# Patient Record
Sex: Male | Born: 1985 | Race: White | Hispanic: No | Marital: Single | Smoking: Current some day smoker
Health system: Southern US, Community
[De-identification: ages and names within clinical notes are randomized; demographics above are authoritative.]

## PROBLEM LIST (undated history)

## (undated) DIAGNOSIS — Z720 Tobacco use: Secondary | ICD-10-CM

## (undated) DIAGNOSIS — F419 Anxiety disorder, unspecified: Secondary | ICD-10-CM

## (undated) DIAGNOSIS — J45909 Unspecified asthma, uncomplicated: Secondary | ICD-10-CM

---
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
## 2004-11-16 ENCOUNTER — Emergency Department (HOSPITAL_COMMUNITY): Admission: EM | Admit: 2004-11-16 | Discharge: 2004-11-16 | Payer: Self-pay | Admitting: Emergency Medicine

## 2005-12-22 ENCOUNTER — Emergency Department (HOSPITAL_COMMUNITY): Admission: EM | Admit: 2005-12-22 | Discharge: 2005-12-22 | Payer: Self-pay | Admitting: Emergency Medicine

## 2007-10-09 ENCOUNTER — Emergency Department (HOSPITAL_COMMUNITY): Admission: EM | Admit: 2007-10-09 | Discharge: 2007-10-09 | Payer: Self-pay | Admitting: Emergency Medicine

## 2010-02-14 ENCOUNTER — Emergency Department (HOSPITAL_BASED_OUTPATIENT_CLINIC_OR_DEPARTMENT_OTHER): Admission: EM | Admit: 2010-02-14 | Discharge: 2010-02-14 | Payer: Self-pay | Admitting: Emergency Medicine

## 2010-09-09 LAB — CBC
HCT: 40.8 % (ref 39.0–52.0)
Hemoglobin: 14.2 g/dL (ref 13.0–17.0)
MCH: 31 pg (ref 26.0–34.0)
MCHC: 34.8 g/dL (ref 30.0–36.0)

## 2010-09-09 LAB — DIFFERENTIAL
Basophils Relative: 3 % — ABNORMAL HIGH (ref 0–1)
Eosinophils Absolute: 0.1 10*3/uL (ref 0.0–0.7)
Monocytes Absolute: 0.5 10*3/uL (ref 0.1–1.0)
Monocytes Relative: 7 % (ref 3–12)

## 2010-09-09 LAB — BASIC METABOLIC PANEL
BUN: 11 mg/dL (ref 6–23)
CO2: 27 mEq/L (ref 19–32)
Calcium: 9.2 mg/dL (ref 8.4–10.5)
Glucose, Bld: 94 mg/dL (ref 70–99)
Sodium: 139 mEq/L (ref 135–145)

## 2010-09-26 ENCOUNTER — Emergency Department (HOSPITAL_COMMUNITY)
Admission: EM | Admit: 2010-09-26 | Discharge: 2010-09-27 | Disposition: A | Discharge status: Home / Self Care | Payer: BC Managed Care – PPO | Attending: Emergency Medicine | Admitting: Emergency Medicine

## 2010-09-26 DIAGNOSIS — L2989 Other pruritus: Secondary | ICD-10-CM | POA: Insufficient documentation

## 2010-09-26 DIAGNOSIS — F329 Major depressive disorder, single episode, unspecified: Secondary | ICD-10-CM | POA: Insufficient documentation

## 2010-09-26 DIAGNOSIS — R209 Unspecified disturbances of skin sensation: Secondary | ICD-10-CM | POA: Insufficient documentation

## 2010-09-26 DIAGNOSIS — F3289 Other specified depressive episodes: Secondary | ICD-10-CM | POA: Insufficient documentation

## 2010-09-26 DIAGNOSIS — F988 Other specified behavioral and emotional disorders with onset usually occurring in childhood and adolescence: Secondary | ICD-10-CM | POA: Insufficient documentation

## 2010-09-26 DIAGNOSIS — R42 Dizziness and giddiness: Secondary | ICD-10-CM | POA: Insufficient documentation

## 2010-09-26 DIAGNOSIS — H5710 Ocular pain, unspecified eye: Secondary | ICD-10-CM | POA: Insufficient documentation

## 2010-09-26 DIAGNOSIS — H11419 Vascular abnormalities of conjunctiva, unspecified eye: Secondary | ICD-10-CM | POA: Insufficient documentation

## 2010-09-26 DIAGNOSIS — L298 Other pruritus: Secondary | ICD-10-CM | POA: Insufficient documentation

## 2010-09-26 DIAGNOSIS — R259 Unspecified abnormal involuntary movements: Secondary | ICD-10-CM | POA: Insufficient documentation

## 2010-09-26 DIAGNOSIS — H5789 Other specified disorders of eye and adnexa: Secondary | ICD-10-CM | POA: Insufficient documentation

## 2010-09-26 DIAGNOSIS — R29810 Facial weakness: Secondary | ICD-10-CM | POA: Insufficient documentation

## 2010-09-26 DIAGNOSIS — Z79899 Other long term (current) drug therapy: Secondary | ICD-10-CM | POA: Insufficient documentation

## 2010-09-27 LAB — POCT I-STAT, CHEM 8
Calcium, Ion: 1.09 mmol/L — ABNORMAL LOW (ref 1.12–1.32)
Glucose, Bld: 106 mg/dL — ABNORMAL HIGH (ref 70–99)
HCT: 48 % (ref 39.0–52.0)
Hemoglobin: 16.3 g/dL (ref 13.0–17.0)
TCO2: 26 mmol/L (ref 0–100)

## 2015-12-16 ENCOUNTER — Emergency Department (HOSPITAL_COMMUNITY)
Admission: EM | Admit: 2015-12-16 | Discharge: 2015-12-16 | Disposition: A | Discharge status: Home / Self Care | Payer: BLUE CROSS/BLUE SHIELD | Attending: Emergency Medicine | Admitting: Emergency Medicine

## 2015-12-16 ENCOUNTER — Encounter (HOSPITAL_COMMUNITY): Payer: Self-pay

## 2015-12-16 ENCOUNTER — Other Ambulatory Visit: Payer: Self-pay

## 2015-12-16 DIAGNOSIS — F410 Panic disorder [episodic paroxysmal anxiety] without agoraphobia: Secondary | ICD-10-CM | POA: Diagnosis not present

## 2015-12-16 DIAGNOSIS — F17200 Nicotine dependence, unspecified, uncomplicated: Secondary | ICD-10-CM | POA: Diagnosis not present

## 2015-12-16 DIAGNOSIS — R0602 Shortness of breath: Secondary | ICD-10-CM | POA: Diagnosis present

## 2015-12-16 HISTORY — DX: Anxiety disorder, unspecified: F41.9

## 2015-12-16 LAB — CBC WITH DIFFERENTIAL/PLATELET
BASOS PCT: 0 %
Basophils Absolute: 0 10*3/uL (ref 0.0–0.1)
EOS ABS: 0.2 10*3/uL (ref 0.0–0.7)
Eosinophils Relative: 3 %
HEMATOCRIT: 39.9 % (ref 39.0–52.0)
Hemoglobin: 14.7 g/dL (ref 13.0–17.0)
LYMPHS ABS: 1.9 10*3/uL (ref 0.7–4.0)
Lymphocytes Relative: 37 %
MCH: 32 pg (ref 26.0–34.0)
MCHC: 36.8 g/dL — AB (ref 30.0–36.0)
MCV: 86.9 fL (ref 78.0–100.0)
MONO ABS: 0.4 10*3/uL (ref 0.1–1.0)
MONOS PCT: 8 %
NEUTROS ABS: 2.7 10*3/uL (ref 1.7–7.7)
Neutrophils Relative %: 52 %
Platelets: 175 10*3/uL (ref 150–400)
RBC: 4.59 MIL/uL (ref 4.22–5.81)
RDW: 11.9 % (ref 11.5–15.5)
WBC: 5.2 10*3/uL (ref 4.0–10.5)

## 2015-12-16 LAB — BASIC METABOLIC PANEL
ANION GAP: 9 (ref 5–15)
BUN: 13 mg/dL (ref 6–20)
CALCIUM: 8.8 mg/dL — AB (ref 8.9–10.3)
CO2: 22 mmol/L (ref 22–32)
CREATININE: 1.06 mg/dL (ref 0.61–1.24)
Chloride: 106 mmol/L (ref 101–111)
GFR calc non Af Amer: >60 mL/min (ref >60)
Glucose, Bld: 98 mg/dL (ref 65–99)
POTASSIUM: 3.1 mmol/L — AB (ref 3.5–5.1)
SODIUM: 137 mmol/L (ref 135–145)

## 2015-12-16 LAB — URINALYSIS, ROUTINE W REFLEX MICROSCOPIC
BILIRUBIN URINE: NEGATIVE
GLUCOSE, UA: NEGATIVE mg/dL
HGB URINE DIPSTICK: NEGATIVE
Ketones, ur: 15 mg/dL — AB
Leukocytes, UA: NEGATIVE
Nitrite: NEGATIVE
Protein, ur: NEGATIVE mg/dL
SPECIFIC GRAVITY, URINE: 1.024 (ref 1.005–1.030)
pH: 5.5 (ref 5.0–8.0)

## 2015-12-16 LAB — ETHANOL: Alcohol, Ethyl (B): <5 mg/dL (ref <5)

## 2015-12-16 LAB — D-DIMER, QUANTITATIVE (NOT AT ARMC)

## 2015-12-16 LAB — RAPID URINE DRUG SCREEN, HOSP PERFORMED
AMPHETAMINES: NOT DETECTED
BARBITURATES: NOT DETECTED
Benzodiazepines: NOT DETECTED
Cocaine: NOT DETECTED
OPIATES: NOT DETECTED
TETRAHYDROCANNABINOL: NOT DETECTED

## 2015-12-16 LAB — TROPONIN I

## 2015-12-16 NOTE — ED Notes (Signed)
Bed: WA03 Expected date:  Expected time:  Means of arrival:  Comments: EMS - 30 M, chest tightness/anxiety

## 2015-12-16 NOTE — ED Notes (Signed)
Pt cannot use restroom at this time, aware urine specimen is needed.  

## 2015-12-16 NOTE — Discharge Instructions (Signed)

## 2015-12-16 NOTE — ED Provider Notes (Signed)
CSN: [id number]     Arrival date & time 12/16/15  [id number] History   First MD Initiated Contact with Patient 12/16/15 0732     Chief Complaint  Patient presents with  . Shortness of Breath     (Consider location/radiation/quality/duration/timing/severity/associated sxs/prior Treatment) HPI   Randy Ortiz is a 30 y.o. male who presents for evaluation of chest pain, shortness of breath, shivering, weakness, near syncope. He was worried that he had a heart attack syndrome to the grocery store to get aspirin. E: EMS on the way, and they ultimately transferred him here. He took a clonazepam prior to leaving home, and now feels better. She is worried that he has had "a mini stroke". He denies prior similar problems, stress at work or home, or prior cardiac problems. He states that his doctor put him on when necessary clonazepam for treatment of "high blood pressure". He saw his doctor last week for a routine checkup. He works for a [employer]. There are no other known modifying factors.    Past Medical History  Diagnosis Date  . Anxiety    History reviewed. No pertinent past surgical history. History reviewed. No pertinent family history. Social History  Substance Use Topics  . Smoking status: Current Some Day Smoker  . Smokeless tobacco: None  . Alcohol Use: Yes     Comment: social    Review of Systems  All other systems reviewed and are negative.     Allergies  Review of patient's allergies indicates no known allergies.  Home Medications   Prior to Admission medications   Medication Sig Start Date End Date Taking? Authorizing Provider  albuterol (PROVENTIL HFA;VENTOLIN HFA) 108 (90 Base) MCG/ACT inhaler Inhale 1-2 puffs into the lungs every 6 (six) hours as needed for wheezing or shortness of breath.   Yes Historical Provider, MD  clonazePAM (KLONOPIN) 1 MG tablet Take 1 mg by mouth 2 (two) times daily as needed for anxiety.   Yes Historical Provider, MD  Multiple  Vitamin (MULTIVITAMIN WITH MINERALS) TABS tablet Take 1 tablet by mouth daily.   Yes Historical Provider, MD   BP 103/55 mmHg  Pulse 55  Temp(Src) 98.3 F (36.8 C) (Oral)  Resp 16  SpO2 98% Physical Exam  Constitutional: He is oriented to person, place, and time. He appears well-developed and well-nourished. He appears distressed (He is uncomfortable).  HENT:  Head: Normocephalic and atraumatic.  Right Ear: External ear normal.  Left Ear: External ear normal.  Eyes: Conjunctivae and EOM are normal. Pupils are equal, round, and reactive to light.  Neck: Normal range of motion and phonation normal. Neck supple.  Cardiovascular: Normal rate, regular rhythm and normal heart sounds.   Pulmonary/Chest: Effort normal and breath sounds normal. He exhibits no bony tenderness.  Abdominal: Soft. There is no tenderness.  Musculoskeletal: Normal range of motion.  Neurological: He is alert and oriented to person, place, and time. No cranial nerve deficit or sensory deficit. He exhibits normal muscle tone. Coordination normal.  Skin: Skin is warm, dry and intact.  Psychiatric: His behavior is normal. Judgment and thought content normal.  Depressed facies. Moderately anxious.  Nursing note and vitals reviewed.   ED Course  Procedures (including critical care time)  Medications - No data to display  Patient Vitals for the past 24 hrs:  BP Temp Temp src Pulse Resp SpO2  12/16/15 1030 103/55 mmHg - - (!) 55 16 98 %  12/16/15 1000 110/67 mmHg - - 67 17 97 %  12/16/15 0955 110/68 mmHg - - 82 25 100 %  12/16/15 0930 110/68 mmHg - - (!) 59 18 98 %  12/16/15 0900 122/81 mmHg - - 80 21 100 %  12/16/15 0830 127/85 mmHg - - 65 17 99 %  12/16/15 0800 128/86 mmHg - - 74 14 100 %  12/16/15 0733 122/91 mmHg 98.3 F (36.8 C) Oral 71 11 100 %    At discharge Reevaluation with update and discussion. After initial assessment and treatment, an updated evaluation reveals he remains comfortable. Findings  discussed with patient and all questions answered. Petina Muraski L     Labs Review Labs Reviewed  BASIC METABOLIC PANEL - Abnormal; Notable for the following:    Potassium 3.1 (*)    Calcium 8.8 (*)    All other components within normal limits  CBC WITH DIFFERENTIAL/PLATELET - Abnormal; Notable for the following:    MCHC 36.8 (*)    All other components within normal limits  URINALYSIS, ROUTINE W REFLEX MICROSCOPIC (NOT AT [facility]) - Abnormal; Notable for the following:    APPearance CLOUDY (*)    Ketones, ur 15 (*)    All other components within normal limits  ETHANOL  URINE RAPID DRUG SCREEN, HOSP PERFORMED  TROPONIN I  D-DIMER, QUANTITATIVE (NOT AT [facility])    Imaging Review No results found. I have personally reviewed and evaluated these images and lab results as part of my medical decision-making.   EKG Interpretation   Date/Time:  Thursday December 16 2015 07:32:59 EDT Ventricular Rate:  73 PR Interval:    QRS Duration: 91 QT Interval:  381 QTC Calculation: 420 R Axis:   75 Text Interpretation:  Sinus rhythm Since last tracing rate slower  Confirmed by Marquelle Musgrave  MD, Cyris Maalouf ([id number]) on 12/16/2015 7:39:52 AM      MDM   Final diagnoses:  Panic attack    Evaluation was consistent with panic attack. No evidence for acute cardiopulmonary compromise. Doubt serous bacterial infection or metabolic instability.   Nursing Notes Reviewed/ Care Coordinated Applicable Imaging Reviewed Interpretation of Laboratory Data incorporated into ED treatment  The patient appears reasonably screened and/or stabilized for discharge and I doubt any other medical condition or other [facility] requiring further screening, evaluation, or treatment in the ED at this time prior to discharge.  Plan: Home Medications- usual medications; Home Treatments- rest; return here if the recommended treatment, does not improve the symptoms; Recommended follow up- PCP prn     Mancel BaleElliott Blair Mesina, MD 12/16/15 [phone]

## 2015-12-16 NOTE — ED Notes (Addendum)
Per EMS, pt from [facility].  Pt woke up short of breath. Drove to [facility] to get an aspirin.  Called 911 on the way to store.  Pt states he started shivering.  Felt weak "like he was going to pass out". Pt took his clonazepam that he has for hx of anxiety.  One occurrence of same in past.  BP elevated on arrival by EMS at 160/100 and decreased in route.  Pt calmer on arrival.  Vitals:  118/82, hr 82, 97% ra, resp 18

## 2015-12-16 NOTE — ED Notes (Signed)
PT DISCHARGED. INSTRUCTIONS GIVEN. AAOX4. PT IN NO APPARENT DISTRESS OR PAIN. THE OPPORTUNITY TO ASK QUESTIONS WAS PROVIDED. 

## 2016-10-12 ENCOUNTER — Observation Stay (HOSPITAL_COMMUNITY): Payer: BLUE CROSS/BLUE SHIELD

## 2016-10-12 ENCOUNTER — Observation Stay (HOSPITAL_COMMUNITY)
Admission: EM | Admit: 2016-10-12 | Discharge: 2016-10-12 | Disposition: A | Discharge status: Home / Self Care | Payer: BLUE CROSS/BLUE SHIELD | Attending: Family Medicine | Admitting: Family Medicine

## 2016-10-12 ENCOUNTER — Emergency Department (HOSPITAL_COMMUNITY): Payer: BLUE CROSS/BLUE SHIELD

## 2016-10-12 ENCOUNTER — Encounter (HOSPITAL_COMMUNITY): Payer: Self-pay

## 2016-10-12 DIAGNOSIS — F17200 Nicotine dependence, unspecified, uncomplicated: Secondary | ICD-10-CM | POA: Diagnosis not present

## 2016-10-12 DIAGNOSIS — R200 Anesthesia of skin: Secondary | ICD-10-CM | POA: Diagnosis not present

## 2016-10-12 DIAGNOSIS — F1510 Other stimulant abuse, uncomplicated: Secondary | ICD-10-CM | POA: Diagnosis not present

## 2016-10-12 DIAGNOSIS — Z720 Tobacco use: Secondary | ICD-10-CM

## 2016-10-12 DIAGNOSIS — R202 Paresthesia of skin: Secondary | ICD-10-CM

## 2016-10-12 DIAGNOSIS — R531 Weakness: Secondary | ICD-10-CM | POA: Insufficient documentation

## 2016-10-12 DIAGNOSIS — E876 Hypokalemia: Secondary | ICD-10-CM | POA: Diagnosis present

## 2016-10-12 DIAGNOSIS — Z79899 Other long term (current) drug therapy: Secondary | ICD-10-CM | POA: Insufficient documentation

## 2016-10-12 DIAGNOSIS — G9340 Encephalopathy, unspecified: Secondary | ICD-10-CM | POA: Diagnosis present

## 2016-10-12 DIAGNOSIS — R29898 Other symptoms and signs involving the musculoskeletal system: Secondary | ICD-10-CM | POA: Diagnosis not present

## 2016-10-12 DIAGNOSIS — F419 Anxiety disorder, unspecified: Secondary | ICD-10-CM

## 2016-10-12 HISTORY — DX: Tobacco use: Z72.0

## 2016-10-12 LAB — CBC
HEMATOCRIT: 45.4 % (ref 39.0–52.0)
HEMOGLOBIN: 16.8 g/dL (ref 13.0–17.0)
MCH: 32.2 pg (ref 26.0–34.0)
MCHC: 37 g/dL — AB (ref 30.0–36.0)
MCV: 87 fL (ref 78.0–100.0)
Platelets: 186 10*3/uL (ref 150–400)
RBC: 5.22 MIL/uL (ref 4.22–5.81)
RDW: 12 % (ref 11.5–15.5)
WBC: 9.4 10*3/uL (ref 4.0–10.5)

## 2016-10-12 LAB — RAPID URINE DRUG SCREEN, HOSP PERFORMED
Amphetamines: POSITIVE — AB
BARBITURATES: NOT DETECTED
Benzodiazepines: NOT DETECTED
COCAINE: NOT DETECTED
Opiates: NOT DETECTED
Tetrahydrocannabinol: NOT DETECTED

## 2016-10-12 LAB — COMPREHENSIVE METABOLIC PANEL
ALK PHOS: 64 U/L (ref 38–126)
ALT: 17 U/L (ref 17–63)
AST: 29 U/L (ref 15–41)
Albumin: 5 g/dL (ref 3.5–5.0)
Anion gap: 10 (ref 5–15)
BILIRUBIN TOTAL: 1.4 mg/dL — AB (ref 0.3–1.2)
BUN: 12 mg/dL (ref 6–20)
CALCIUM: 9.9 mg/dL (ref 8.9–10.3)
CO2: 28 mmol/L (ref 22–32)
CREATININE: 1 mg/dL (ref 0.61–1.24)
Chloride: 99 mmol/L — ABNORMAL LOW (ref 101–111)
GFR calc Af Amer: >60 mL/min (ref >60)
Glucose, Bld: 106 mg/dL — ABNORMAL HIGH (ref 65–99)
POTASSIUM: 3.3 mmol/L — AB (ref 3.5–5.1)
Sodium: 137 mmol/L (ref 135–145)
TOTAL PROTEIN: 8 g/dL (ref 6.5–8.1)

## 2016-10-12 LAB — DIFFERENTIAL
BASOS ABS: 0 10*3/uL (ref 0.0–0.1)
Basophils Relative: 0 %
EOS ABS: 0 10*3/uL (ref 0.0–0.7)
Eosinophils Relative: 0 %
LYMPHS ABS: 0.8 10*3/uL (ref 0.7–4.0)
Lymphocytes Relative: 9 %
MONOS PCT: 7 %
Monocytes Absolute: 0.7 10*3/uL (ref 0.1–1.0)
Neutro Abs: 7.9 10*3/uL — ABNORMAL HIGH (ref 1.7–7.7)
Neutrophils Relative %: 84 %

## 2016-10-12 LAB — ACETAMINOPHEN LEVEL

## 2016-10-12 LAB — PROTIME-INR
INR: 1
Prothrombin Time: 13.2 seconds (ref 11.4–15.2)

## 2016-10-12 LAB — URINALYSIS, ROUTINE W REFLEX MICROSCOPIC
Bilirubin Urine: NEGATIVE
Glucose, UA: NEGATIVE mg/dL
Hgb urine dipstick: NEGATIVE
KETONES UR: NEGATIVE mg/dL
LEUKOCYTES UA: NEGATIVE
NITRITE: NEGATIVE
PROTEIN: NEGATIVE mg/dL
Specific Gravity, Urine: 1.001 — ABNORMAL LOW (ref 1.005–1.030)
pH: 7 (ref 5.0–8.0)

## 2016-10-12 LAB — HIV ANTIBODY (ROUTINE TESTING W REFLEX): HIV Screen 4th Generation wRfx: NONREACTIVE

## 2016-10-12 LAB — APTT: aPTT: 25 seconds (ref 24–36)

## 2016-10-12 LAB — I-STAT TROPONIN, ED: TROPONIN I, POC: 0 ng/mL (ref 0.00–0.08)

## 2016-10-12 LAB — SALICYLATE LEVEL

## 2016-10-12 LAB — ETHANOL: Alcohol, Ethyl (B): <5 mg/dL (ref <5)

## 2016-10-12 MED ORDER — GADOBENATE DIMEGLUMINE 529 MG/ML IV SOLN
20.0000 mL | Freq: Once | INTRAVENOUS | Status: AC | PRN
  Administered 2016-10-12: 16 mL via INTRAVENOUS

## 2016-10-12 MED ORDER — STROKE: EARLY STAGES OF RECOVERY BOOK
Freq: Once | Status: DC
  Filled 2016-10-12: qty 1

## 2016-10-12 MED ORDER — SENNOSIDES-DOCUSATE SODIUM 8.6-50 MG PO TABS: 1.0000 | ORAL_TABLET | Freq: Every evening | ORAL | Status: DC | PRN

## 2016-10-12 MED ORDER — LABETALOL HCL 5 MG/ML IV SOLN: 5.0000 mg | Freq: Once | INTRAVENOUS | Status: DC

## 2016-10-12 MED ORDER — NICOTINE 21 MG/24HR TD PT24
21.0000 mg | MEDICATED_PATCH | Freq: Every day | TRANSDERMAL | Status: DC
  Administered 2016-10-12: 21 mg via TRANSDERMAL
  Filled 2016-10-12: qty 1

## 2016-10-12 MED ORDER — ZOLPIDEM TARTRATE 5 MG PO TABS: 5.0000 mg | ORAL_TABLET | Freq: Every evening | ORAL | Status: DC | PRN

## 2016-10-12 MED ORDER — ACETAMINOPHEN 325 MG PO TABS: 650.0000 mg | ORAL_TABLET | ORAL | Status: DC | PRN

## 2016-10-12 MED ORDER — ACETAMINOPHEN 650 MG RE SUPP: 650.0000 mg | RECTAL | Status: DC | PRN

## 2016-10-12 MED ORDER — ASPIRIN 300 MG RE SUPP: 300.0000 mg | Freq: Every day | RECTAL | Status: DC

## 2016-10-12 MED ORDER — ENOXAPARIN SODIUM 40 MG/0.4ML ~~LOC~~ SOLN
40.0000 mg | SUBCUTANEOUS | Status: DC
  Administered 2016-10-12: 40 mg via SUBCUTANEOUS
  Filled 2016-10-12: qty 0.4

## 2016-10-12 MED ORDER — ACETAMINOPHEN 160 MG/5ML PO SOLN: 650.0000 mg | ORAL | Status: DC | PRN

## 2016-10-12 MED ORDER — SODIUM CHLORIDE 0.9 % IV SOLN
INTRAVENOUS | Status: DC
  Administered 2016-10-12: 09:00:00 via INTRAVENOUS

## 2016-10-12 MED ORDER — ALBUTEROL SULFATE (2.5 MG/3ML) 0.083% IN NEBU: 3.0000 mL | INHALATION_SOLUTION | Freq: Four times a day (QID) | RESPIRATORY_TRACT | Status: DC | PRN

## 2016-10-12 MED ORDER — POTASSIUM CHLORIDE 20 MEQ/15ML (10%) PO SOLN: 20.0000 meq | Freq: Once | ORAL | Status: DC

## 2016-10-12 MED ORDER — CLONAZEPAM 0.5 MG PO TABS: 0.5000 mg | ORAL_TABLET | Freq: Two times a day (BID) | ORAL | Status: DC | PRN

## 2016-10-12 MED ORDER — ASPIRIN 325 MG PO TABS: 325.0000 mg | ORAL_TABLET | Freq: Every day | ORAL | Status: DC

## 2016-10-12 NOTE — Progress Notes (Signed)
OT Cancellation Note  Patient Details Name: Randy Ortiz MRN: [id number] DOB: 24-Aug-1985   Cancelled Treatment:    Reason Eval/Treat Not Completed: Other (comment). Noted pt is on bedrest and MRI is ordered. Will check back.  Randy Ortiz 10/12/2016, 7:39 AM  Randy Ortiz, OTR/L [phone] 10/12/2016

## 2016-10-12 NOTE — ED Triage Notes (Signed)
States feels like the right side of his mind is going to make him pass out no deficits noted moves all extremities clear speech noted.

## 2016-10-12 NOTE — ED Provider Notes (Signed)
[facility]-EMERGENCY DEPT Provider Note   CSN: [id number] Arrival date & time: 10/12/16  0126  By signing my name below, I, Elder Negus, attest that this documentation has been prepared under the direction and in the presence of Devoria Albe, MD. Electronically Signed: Elder Negus, Scribe. 10/12/16. 2:18 AM.  Time seen 02:01 AM   History   Chief Complaint Chief Complaint  Patient presents with  . Near Syncope    HPI Randy Ortiz is a 31 y.o. male with history of anxiety on Adderall and Clonazepam who presents to the ED for evaluation of altered mental status. This patient states that he was at work 30 minutes PTA  when he developed weakness in the R arm, decreased sensation in the R face, and burning sensations across the entire body. He is intermittently stating that "he cannot think" and "he loses his train of thought". No headache. His friend drove him to this facility. At interview, he denies any vision changes, leg symptoms. He has never experienced these complaints before. No family history of stroke. The patient does state that he "took 1 or 2 extra Adderall" 8 hours ago. He has been prescribed this medication for 6 months; however he has been taking more than his typical dose in the last 2 days. He states "I'm addicted to it and I like the feeling of it". States he started using it recreationally when he was in school and he asked his doctors to putting on it at that time. He states 6 months ago he asked his primary care doctor to putting back on it because he was going to college. Unclear why exactly the patient is on Adderall. Denies any misdosing of his Clonazepam. States he takes it for panic contacts and only takes about 10 a month. Denies any other drug use. He denies any suicidal ideation. Patient answers questions oddly. When asked to describe the numbness or weakness in his extremities he starts talking about his thinking. We discussed that his arm and leg do not think he  needed to be more clear about what was going on with his arm and leg. He states his gait was normal. There is no family history of strokes.  The history is provided by the patient. No language interpreter was used.   [facility], Zoe Lan NP   Past Medical History:  Diagnosis Date  . Anxiety     Patient Active Problem List   Diagnosis Date Noted  . Anxiety     History reviewed. No pertinent surgical history.     Home Medications    Prior to Admission medications   Medication Sig Start Date End Date Taking? Authorizing Provider  albuterol (PROVENTIL HFA;VENTOLIN HFA) 108 (90 Base) MCG/ACT inhaler Inhale 1-2 puffs into the lungs every 6 (six) hours as needed for wheezing or shortness of breath.   Yes Historical Provider, MD  amphetamine-dextroamphetamine (ADDERALL) 20 MG tablet Take 20 mg by mouth daily. 10/06/16 10/06/17 Yes Historical Provider, MD  clonazePAM (KLONOPIN) 1 MG tablet Take 1 mg by mouth 2 (two) times daily as needed for anxiety.   Yes Historical Provider, MD    Family History History reviewed. No pertinent family history.  Social History Social History  Substance Use Topics  . Smoking status: Current Some Day Smoker  . Smokeless tobacco: Never Used  . Alcohol use Yes     Comment: social  works at [employer], states he was moved to a different area which is less stressful.  Allergies   Patient has no known allergies.   Review of Systems Review of Systems  Eyes: Negative for visual disturbance.  Neurological: Positive for weakness and numbness. Negative for speech difficulty and headaches.  Psychiatric/Behavioral:       Substance abuse  All other systems reviewed and are negative.    Physical Exam Updated Vital Signs BP (!) 154/101 (BP Location: Left Arm)   Pulse (!) 106   Temp 98 F (36.7 C) (Oral)   Resp 18   SpO2 98%   Vital signs normal except for hypertension and tachycardia   Physical Exam  Constitutional: He is  oriented to person, place, and time. He appears well-developed and well-nourished.  Non-toxic appearance. He does not appear ill. No distress.  HENT:  Head: Normocephalic and atraumatic.  Right Ear: External ear normal.  Left Ear: External ear normal.  Nose: Nose normal. No mucosal edema or rhinorrhea.  Mouth/Throat: Oropharynx is clear and moist and mucous membranes are normal. No dental abscesses or uvula swelling.  Eyes: Conjunctivae and EOM are normal. Pupils are equal, round, and reactive to light.  Pupils are dilated but reactive bilaterally.   Neck: Normal range of motion and full passive range of motion without pain. Neck supple.  Cardiovascular: Normal rate, regular rhythm and normal heart sounds.  Exam reveals no gallop and no friction rub.   No murmur heard. Pulmonary/Chest: Effort normal and breath sounds normal. No respiratory distress. He has no wheezes. He has no rhonchi. He has no rales. He exhibits no tenderness and no crepitus.  Abdominal: Soft. Normal appearance and bowel sounds are normal. He exhibits no distension. There is no tenderness. There is no rebound and no guarding.  Musculoskeletal: Normal range of motion. He exhibits no edema or tenderness.  Moves all extremities well.   Neurological: He is alert and oriented to person, place, and time. He has normal strength. No cranial nerve deficit.  Grip strength is equal. No motor weaknesses. No decreased sensation bilaterally. FTN coordinated.   Skin: Skin is warm, dry and intact. No rash noted. No erythema. No pallor.  Psychiatric: He has a normal mood and affect. His speech is normal and behavior is normal. His mood appears not anxious.  Nursing note and vitals reviewed.    ED Treatments / Results  Labs (all labs ordered are listed, but only abnormal results are displayed) Results for orders placed or performed during the hospital encounter of 10/12/16  Ethanol  Result Value Ref Range   Alcohol, Ethyl (B) <5 <5  mg/dL  Protime-INR  Result Value Ref Range   Prothrombin Time 13.2 11.4 - 15.2 seconds   INR 1.00   APTT  Result Value Ref Range   aPTT 25 24 - 36 seconds  CBC  Result Value Ref Range   WBC 9.4 4.0 - 10.5 K/uL   RBC 5.22 4.22 - 5.81 MIL/uL   Hemoglobin 16.8 13.0 - 17.0 g/dL   HCT 16.1 09.6 - 04.5 %   MCV 87.0 78.0 - 100.0 fL   MCH 32.2 26.0 - 34.0 pg   MCHC 37.0 (H) 30.0 - 36.0 g/dL   RDW 40.9 81.1 - 91.4 %   Platelets 186 150 - 400 K/uL  Differential  Result Value Ref Range   Neutrophils Relative % 84 %   Neutro Abs 7.9 (H) 1.7 - 7.7 K/uL   Lymphocytes Relative 9 %   Lymphs Abs 0.8 0.7 - 4.0 K/uL   Monocytes Relative 7 %   Monocytes  Absolute 0.7 0.1 - 1.0 K/uL   Eosinophils Relative 0 %   Eosinophils Absolute 0.0 0.0 - 0.7 K/uL   Basophils Relative 0 %   Basophils Absolute 0.0 0.0 - 0.1 K/uL  Comprehensive metabolic panel  Result Value Ref Range   Sodium 137 135 - 145 mmol/L   Potassium 3.3 (L) 3.5 - 5.1 mmol/L   Chloride 99 (L) 101 - 111 mmol/L   CO2 28 22 - 32 mmol/L   Glucose, Bld 106 (H) 65 - 99 mg/dL   BUN 12 6 - 20 mg/dL   Creatinine, Ser 1.61 0.61 - 1.24 mg/dL   Calcium 9.9 8.9 - 09.6 mg/dL   Total Protein 8.0 6.5 - 8.1 g/dL   Albumin 5.0 3.5 - 5.0 g/dL   AST 29 15 - 41 U/L   ALT 17 17 - 63 U/L   Alkaline Phosphatase 64 38 - 126 U/L   Total Bilirubin 1.4 (H) 0.3 - 1.2 mg/dL   GFR calc non Af Amer >60 >60 mL/min   GFR calc Af Amer >60 >60 mL/min   Anion gap 10 5 - 15  Urine rapid drug screen (hosp performed)not at [facility]  Result Value Ref Range   Opiates NONE DETECTED NONE DETECTED   Cocaine NONE DETECTED NONE DETECTED   Benzodiazepines NONE DETECTED NONE DETECTED   Amphetamines POSITIVE (A) NONE DETECTED   Tetrahydrocannabinol NONE DETECTED NONE DETECTED   Barbiturates NONE DETECTED NONE DETECTED  Urinalysis, Routine w reflex microscopic  Result Value Ref Range   Color, Urine COLORLESS (A) YELLOW   APPearance CLEAR CLEAR   Specific Gravity, Urine  1.001 (L) 1.005 - 1.030   pH 7.0 5.0 - 8.0   Glucose, UA NEGATIVE NEGATIVE mg/dL   Hgb urine dipstick NEGATIVE NEGATIVE   Bilirubin Urine NEGATIVE NEGATIVE   Ketones, ur NEGATIVE NEGATIVE mg/dL   Protein, ur NEGATIVE NEGATIVE mg/dL   Nitrite NEGATIVE NEGATIVE   Leukocytes, UA NEGATIVE NEGATIVE  I-stat troponin, ED (not at [facility])  Result Value Ref Range   Troponin i, poc 0.00 0.00 - 0.08 ng/mL   Comment 3           Laboratory interpretation all normal except for + UDS    EKG  EKG Interpretation  Date/Time:  Thursday October 12 2016 01:35:47 EDT Ventricular Rate:  113 PR Interval:    QRS Duration: 79 QT Interval:  351 QTC Calculation: 482 R Axis:   71 Text Interpretation:  Sinus tachycardia Consider right atrial enlargement Borderline repolarization abnormality Borderline prolonged QT interval Since last tracing rate faster Confirmed by Effie Shy  MD, ELLIOTT [phone]) on 10/12/2016 1:41:23 AM       Radiology Ct Head Code Stroke W/o Cm  Result Date: 10/12/2016 CLINICAL DATA:  Code stroke. Initial evaluation for right-sided facial numbness. EXAM: CT HEAD WITHOUT CONTRAST TECHNIQUE: Contiguous axial images were obtained from the base of the skull through the vertex without intravenous contrast. COMPARISON:  Prior CT from 10/09/2007. FINDINGS: Brain: Stable cerebral volume. Focal hyperdensity adjacent to the right lateral ventricle is stable, likely a small calcification. No acute intracranial hemorrhage. No evidence for acute large vessel territory infarct. No mass lesion, midline shift or mass effect. No hydrocephalus. No extra-axial fluid collection. Vascular: No hyperdense vessel. Skull: Scalp soft tissues within normal limits.  Calvarium intact. Sinuses/Orbits: Globes and oval soft tissues within normal limits. Paranasal sinuses and mastoids are clear. Other: None. ASPECTS [facility] Stroke Program Early CT Score) - Ganglionic level infarction (caudate, lentiform nuclei, internal  capsule, insula, M1-M3 cortex): 7 - Supraganglionic infarction (M4-M6 cortex): 3 Total score (0-10 with 10 being normal): 10 IMPRESSION: 1. No acute intracranial infarct or other process identified. 2. ASPECTS is 10 3. Subcentimeter calcification adjacent to the frontal horn of the right lateral ventricle, similar from 2009. Critical Value/emergent results were called by telephone at the time of interpretation on 10/12/2016 at 3:34 am to Dr. Devoria Albe , who verbally acknowledged these results. Electronically Signed   By: Rise Mu M.D.   On: 10/12/2016 03:39    Procedures Procedures (including critical care time)  Medications Ordered in ED Medications - No data to display   Initial Impression / Assessment and Plan / ED Course  I have reviewed the triage vital signs and the nursing notes.  Pertinent labs & imaging results that were available during my care of the patient were reviewed by me and considered in my medical decision making (see chart for details).    COORDINATION OF CARE: 2:17 AM Discussed obtaining a head CT and labwork. The patient verbalized agreement with this plan.   NIH score 0  02:35 AM pt discussed with Dr Roseanne Reno, Neurology, Code Stroke cancelled, recommends admission to hospitalist service here b/o adderal ingestion. States not a tPA candidate.  03:33 AM Radiology called the head CT results, normal  I was going to treat patient's tachycardia and hypertension with labetalol however his blood pressure improved without treatment. He had a residual borderline tachycardia around 105.  Patient was rechecked at 5 AM. We discussed his test results and the recommendation by the neurologist. He is agreeable to be admitted.  5:08 AM Dr Clyde Lundborg, hospitalist will admit  Final Clinical Impressions(s) / ED Diagnoses   Final diagnoses:  Anxiety  Numbness and tingling of right face  Numbness and tingling of right upper extremity  Adderall use disorder, mild, abuse     Plan admission  Devoria Albe, MD, FACEP    I personally performed the services described in this documentation, which was scribed in my presence. The recorded information has been reviewed and considered.  Devoria Albe, MD, Concha Pyo, MD 10/12/16 [phone]

## 2016-10-12 NOTE — Discharge Summary (Signed)
Physician Discharge Summary  Randy Ortiz ZOX:[id number] DOB: 1985/10/17 DOA: 10/12/2016  PCP: No PCP Per Patient  Admit date: 10/12/2016 Discharge date: 10/12/2016  Admitted From: Home Disposition: Home   Recommendations for Outpatient Follow-up:  1. Follow up with PCP as previously scheduled next week 2. Continue counseling regarding tobacco use, history of alcohol abuse (EtOH negative here) and adderall misuse (took "3 or 4" pills to stay up gaming; may be worsening anxiety).   Brief/Interim Summary: Randy Ortiz is a 31 y.o. male with a history of anxiety, alcohol abuse, and tobacco use who presented to the ED due to confusion.   While doing light work at his job on 3rd shift he noted an episode he finds difficult to characterize, described as whole body burning, dizziness, right "mind" numbness and "blacking in and out" without falling or weakness. This gradually resolved spontaneously after a few hours, but he was scared for stroke so he left work for the ED. On arrival the patient was oriented. HR 106bpm, BP 154/101, afebrile, no respiratory distress and labs were unremarkable, CT head was negative. UDS positive for amphetamines. He reported taking adderall  as needed for ADHD symptoms related to school but took "3 or 4" pills the previous evening to stay up gaming.   He was placed on telemetry for observation. Subsequent MRI was completely negative. The case was discussed with neurology who recommended no further work up. Adderall may have exacerbated underlying anxiety, triggering a panic attack, and the intentional overdose is concerning for addictive behavior. Symptoms may have been exacerbated by adderall-related sleep deprivation as well. His symptoms have remained resolved, with negative neurological exam, so it is advised that he hold adderall pending follow up with PCP. He is discharged in stable condition.  Discharge Diagnoses:  Principal Problem:   Acute  encephalopathy Active Problems:   Anxiety   Hypokalemia   Right facial numbness   Right arm weakness   Tobacco abuse   Adderall use disorder, mild, abuse  Intentional adderall overdose and acute encephalopathy: Neurologic work up negative as below. Symptoms resolved with IVF's and observation.  - Follow up with PCP, hold adderall for now  Anxiety: Chronic, stable.  - Ok to continue klonopin  Hypokalemia: K= 3.3 on admission. This was repleted and may be rechecked at follow up.   Tobacco abuse: - Brief cessation counseling provided. Per Care Everywhere records, he has endorsed alcohol overuse on weekends as well. He was counseled to limit alcohol consumption to < 2 drinks per day.  - Nicotine patch  Discharge Instructions Discharge Instructions    Discharge instructions    Complete by:  As directed    You were admitted with altered mental status. A stroke work up was completed and negative. It is suspected that the symptoms were related to adderall over use and this should be discontinued until you follow up with your primary provider. If your symptoms return, seek medical attention.     Allergies as of 10/12/2016   No Known Allergies     Medication List    STOP taking these medications   amphetamine-dextroamphetamine 20 MG tablet Commonly known as:  ADDERALL     TAKE these medications   albuterol 108 (90 Base) MCG/ACT inhaler Commonly known as:  PROVENTIL HFA;VENTOLIN HFA Inhale 1-2 puffs into the lungs every 6 (six) hours as needed for wheezing or shortness of breath.   clonazePAM 1 MG tablet Commonly known as:  KLONOPIN Take 1 mg by mouth 2 (two)  times daily as needed for anxiety.      Follow-up Information    Iona Hansen, NP Follow up.   Specialty:  Nurse Practitioner Contact information: [address] city [place] [address] [phone]          No Known Allergies  Consultations:  Neurology, Dr. Roseanne Reno and Dr. Roxy Manns by  phone.  Procedures/Studies: Mr Laqueta Jean OZ Contrast  Result Date: 10/12/2016 CLINICAL DATA:  Acute encephalopathy.  Adderall overdose last night. EXAM: MRI HEAD WITHOUT AND WITH CONTRAST TECHNIQUE: Multiplanar, multiecho pulse sequences of the brain and surrounding structures were obtained without and with intravenous contrast. CONTRAST:  16mL MULTIHANCE GADOBENATE DIMEGLUMINE 529 MG/ML IV SOLN COMPARISON:  Head CT from earlier today FINDINGS: Brain: No acute or remote infarction, hemorrhage, hydrocephalus, extra-axial collection or mass lesion. Right frontal white matter calcification by CT, stable since at least 2009, is difficult to visualize on this exam. No abnormal enhancement in this region. Minor FLAIR hyperintensity around the lateral ventricles, non acute. Normal brain volume. Vascular: Normal flow voids Skull and upper cervical spine: Negative Sinuses/Orbits: Dysconjugate gaze, nonspecific. IMPRESSION: Negative brain MRI.  No explanation for symptoms. Electronically Signed   By: Marnee Spring M.D.   On: 10/12/2016 08:50   Ct Head Code Stroke W/o Cm  Result Date: 10/12/2016 CLINICAL DATA:  Code stroke. Initial evaluation for right-sided facial numbness. EXAM: CT HEAD WITHOUT CONTRAST TECHNIQUE: Contiguous axial images were obtained from the base of the skull through the vertex without intravenous contrast. COMPARISON:  Prior CT from 10/09/2007. FINDINGS: Brain: Stable cerebral volume. Focal hyperdensity adjacent to the right lateral ventricle is stable, likely a small calcification. No acute intracranial hemorrhage. No evidence for acute large vessel territory infarct. No mass lesion, midline shift or mass effect. No hydrocephalus. No extra-axial fluid collection. Vascular: No hyperdense vessel. Skull: Scalp soft tissues within normal limits.  Calvarium intact. Sinuses/Orbits: Globes and oval soft tissues within normal limits. Paranasal sinuses and mastoids are clear. Other: None. ASPECTS  [facility] Stroke Program Early CT Score) - Ganglionic level infarction (caudate, lentiform nuclei, internal capsule, insula, M1-M3 cortex): 7 - Supraganglionic infarction (M4-M6 cortex): 3 Total score (0-10 with 10 being normal): 10 IMPRESSION: 1. No acute intracranial infarct or other process identified. 2. ASPECTS is 10 3. Subcentimeter calcification adjacent to the frontal horn of the right lateral ventricle, similar from 2009. Critical Value/emergent results were called by telephone at the time of interpretation on 10/12/2016 at 3:34 am to Dr. Devoria Albe , who verbally acknowledged these results. Electronically Signed   By: Rise Mu M.D.   On: 10/12/2016 03:39   Subjective: Pt's symptoms have been resolved for several hours. He denies any weakness, numbness, speech problems. Confirms above history and wants to go home.   Discharge Exam: BP 136/62 (BP Location: Left Arm)   Pulse (!) 102   Temp 98 F (36.7 C) (Oral)   Resp 18   Ht  (1.753 m)   Wt 76.4 kg (168 lb 8 oz)   SpO2 98%   BMI 24.88 kg/m   General: No distress Cardiovascular: RRR, no murmur or JVD Respiratory: Nonlabored, clear Neuro/psych: Alert, oriented, CN's intact. Gait, speech, and visual fields normal. No tremor. Appears tired.   Labs: Basic Metabolic Panel:  Recent Labs Lab 10/12/16 0246  NA 137  K 3.3*  CL 99*  CO2 28  GLUCOSE 106*  BUN 12  CREATININE 1.00  CALCIUM 9.9   Liver Function Tests:  Recent Labs Lab  10/12/16 0246  AST 29  ALT 17  ALKPHOS 64  BILITOT 1.4*  PROT 8.0  ALBUMIN 5.0   CBC:  Recent Labs Lab 10/12/16 0246  WBC 9.4  NEUTROABS 7.9*  HGB 16.8  HCT 45.4  MCV 87.0  PLT 186   Urinalysis    Component Value Date/Time   COLORURINE COLORLESS (A) 10/12/2016 0242   APPEARANCEUR CLEAR 10/12/2016 0242   LABSPEC 1.001 (L) 10/12/2016 0242   PHURINE 7.0 10/12/2016 0242   GLUCOSEU NEGATIVE 10/12/2016 0242   HGBUR NEGATIVE 10/12/2016 0242   BILIRUBINUR NEGATIVE  10/12/2016 0242   KETONESUR NEGATIVE 10/12/2016 0242   PROTEINUR NEGATIVE 10/12/2016 0242   NITRITE NEGATIVE 10/12/2016 0242   LEUKOCYTESUR NEGATIVE 10/12/2016 0242   Time coordinating discharge: Approximately 40 minutes  Hazeline Junker, MD  [facility] 10/12/2016, 10:50 AM Pager [phone]

## 2016-10-12 NOTE — ED Triage Notes (Signed)
States feels numb on right side of his face, also says it's hard for him to find his words.

## 2016-10-12 NOTE — Progress Notes (Signed)
PT Cancellation Note  Patient Details Name: KNOAH NEDEAU MRN: [id number] DOB: 05/23/1986   Cancelled Treatment:    Reason Eval/Treat Not Completed: Other (comment) Pt on bedrest.  Please update activity level for PT to evaluate. Thanks.   Shantoria Ellwood,KATHrine E 10/12/2016, 8:25 AM Zenovia Jarred, PT, DPT 10/12/2016 Pager: [phone]

## 2016-10-12 NOTE — H&P (Addendum)
History and Physical    Randy Ortiz ZOX:[id number] DOB: [birth year] DOA: 10/12/2016  Referring MD/NP/PA:   PCP: No PCP Per Patient   Patient coming from:  The patient is coming from home.  At baseline, pt is independent for most of ADL.   Chief Complaint: AMS, R facial numbness, right arm weakness  HPI: Randy Ortiz is a 31 y.o. male with medical history significant of tobacco abuse, anxiety, who presents with altered mental status, R facial numbness and right arm weakness.  Patient states that he was working for [employer] in the building, and started feeling confused at about 11:15 PM. He also has whole body burning, dizziness, right facial numbness, right arm weakness, difficulty finding words. The symptoms lasted for about several hours, then gradually resolved. 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 He took 3 or 4 extra pills last night. Patient denies chest pain, shortness rest, cough, nausea, vomiting, diarrhea, abdominal pain, symptoms of UTI. No vision change or hearing loss. No fever or chills. Currently patient is mildly confused, but oriented 3.  ED Course: pt was found to have positive UDS for amphetamine, negative urinalysis, alcohol level less than 5, WBC 9.4, negative troponin, INR 1.0, potassium 3.3, creatinine normal, temperature normal, tachycardia, tachypnea, oxygen saturation 98% on room air, CT head is negative for acute intracranial abnormalities. Patient is placed on telemetry bed for observation. EDP consulted neurology, Dr. Roseanne Reno, who recommends admission to hospitalist service in [facility] b/o adderal ingestion. And need to call back to neurology if needed.  Review of Systems:   General: no fevers, chills, no changes in body weight HEENT: no blurry vision, hearing changes or sore throat Respiratory: no dyspnea, coughing, wheezing CV: no chest pain, no palpitations GI: no nausea, vomiting, abdominal pain,  diarrhea, constipation GU: no dysuria, burning on urination, increased urinary frequency, hematuria  Ext: no leg edema Neuro: has dizziness, right facial numbness, right arm weakness, difficulty finding words. Skin: no rash, no skin tear. MSK: No muscle spasm, no deformity, no limitation of range of movement in spin Heme: No easy bruising.  Travel history: No recent long distant travel.  Allergy: No Known Allergies  Past Medical History:  Diagnosis Date  . Anxiety   . Tobacco abuse     History reviewed. No pertinent surgical history.  Social History:  reports that he has been smoking.  He has never used smokeless tobacco. He reports that he drinks alcohol. He reports that he does not use drugs.  Family History:  Family History  Problem Relation Age of Onset  . Thyroid cancer Mother   . Hypertension Father      Prior to Admission medications   Medication Sig Start Date End Date Taking? Authorizing Provider  albuterol (PROVENTIL HFA;VENTOLIN HFA) 108 (90 Base) MCG/ACT inhaler Inhale 1-2 puffs into the lungs every 6 (six) hours as needed for wheezing or shortness of breath.   Yes Historical Provider, MD  amphetamine-dextroamphetamine (ADDERALL) 20 MG tablet Take 20 mg by mouth daily. 10/06/16 10/06/17 Yes Historical Provider, MD  clonazePAM (KLONOPIN) 1 MG tablet Take 1 mg by mouth 2 (two) times daily as needed for anxiety.   Yes Historical Provider, MD    Physical Exam: Vitals:   10/12/16 0132 10/12/16 0302 10/12/16 0348 10/12/16 0502  BP: (!) 154/101 (!) 160/109 135/82 125/60  Pulse: (!) 106 (!) 109 99 (!) 107  Resp: 18 (!) 23 20 (!) 23  Temp: 98  F (36.7 C)     TempSrc: Oral     SpO2: 98% 99% 100% 100%   General: Not in acute distress HEENT:       Eyes: PERRL, EOMI, no scleral icterus.       ENT: No discharge from the ears and nose, no pharynx injection, no tonsillar enlargement.        Neck: No JVD, no bruit, no mass felt. Heme: No neck lymph node  enlargement. Cardiac: S1/S2, RRR, No murmurs, No gallops or rubs. Respiratory: No rales, wheezing, rhonchi or rubs. GI: Soft, nondistended, nontender, no rebound pain, no organomegaly, BS present. GU: No hematuria Ext: No pitting leg edema bilaterally. 2+DP/PT pulse bilaterally. Musculoskeletal: No joint deformities, No joint redness or warmth, no limitation of ROM in spin. Skin: No rashes.  Neuro: mildly confused, but oriented X3, cranial nerves II-XII grossly intact, moves all extremities normally. Muscle strength 5/5 in all extremities, sensation to light touch intact. Brachial reflex 2+ bilaterally. Negative Babinski's sign. Normal finger to nose test. Psych: Patient is not psychotic, no suicidal or hemocidal ideation.  Labs on Admission: I have personally reviewed following labs and imaging studies  CBC:  Recent Labs Lab 10/12/16 0246  WBC 9.4  NEUTROABS 7.9*  HGB 16.8  HCT 45.4  MCV 87.0  PLT 186   Basic Metabolic Panel:  Recent Labs Lab 10/12/16 0246  NA 137  K 3.3*  CL 99*  CO2 28  GLUCOSE 106*  BUN 12  CREATININE 1.00  CALCIUM 9.9   GFR: CrCl cannot be calculated (Unknown ideal weight.). Liver Function Tests:  Recent Labs Lab 10/12/16 0246  AST 29  ALT 17  ALKPHOS 64  BILITOT 1.4*  PROT 8.0  ALBUMIN 5.0   No results for input(s): LIPASE, AMYLASE in the last 168 hours. No results for input(s): AMMONIA in the last 168 hours. Coagulation Profile:  Recent Labs Lab 10/12/16 0246  INR 1.00   Cardiac Enzymes: No results for input(s): CKTOTAL, CKMB, CKMBINDEX, TROPONINI in the last 168 hours. BNP (last 3 results) No results for input(s): PROBNP in the last 8760 hours. HbA1C: No results for input(s): HGBA1C in the last 72 hours. CBG: No results for input(s): GLUCAP in the last 168 hours. Lipid Profile: No results for input(s): CHOL, HDL, LDLCALC, TRIG, CHOLHDL, LDLDIRECT in the last 72 hours. Thyroid Function Tests: No results for input(s):  TSH, T4TOTAL, FREET4, T3FREE, THYROIDAB in the last 72 hours. Anemia Panel: No results for input(s): VITAMINB12, FOLATE, FERRITIN, TIBC, IRON, RETICCTPCT in the last 72 hours. Urine analysis:    Component Value Date/Time   COLORURINE COLORLESS (A) 10/12/2016 0242   APPEARANCEUR CLEAR 10/12/2016 0242   LABSPEC 1.001 (L) 10/12/2016 0242   PHURINE 7.0 10/12/2016 0242   GLUCOSEU NEGATIVE 10/12/2016 0242   HGBUR NEGATIVE 10/12/2016 0242   BILIRUBINUR NEGATIVE 10/12/2016 0242   KETONESUR NEGATIVE 10/12/2016 0242   PROTEINUR NEGATIVE 10/12/2016 0242   NITRITE NEGATIVE 10/12/2016 0242   LEUKOCYTESUR NEGATIVE 10/12/2016 0242   Sepsis Labs: (procalcitonin:4,lacticidven:4) )No results found for this or any previous visit (from the past 240 hour(s)).   Radiological Exams on Admission: Ct Head Code Stroke W/o Cm  Result Date: 10/12/2016 CLINICAL DATA:  Code stroke. Initial evaluation for right-sided facial numbness. EXAM: CT HEAD WITHOUT CONTRAST TECHNIQUE: Contiguous axial images were obtained from the base of the skull through the vertex without intravenous contrast. COMPARISON:  Prior CT from 10/09/2007. FINDINGS: Brain: Stable cerebral volume. Focal hyperdensity adjacent to the right lateral ventricle  is stable, likely a small calcification. No acute intracranial hemorrhage. No evidence for acute large vessel territory infarct. No mass lesion, midline shift or mass effect. No hydrocephalus. No extra-axial fluid collection. Vascular: No hyperdense vessel. Skull: Scalp soft tissues within normal limits.  Calvarium intact. Sinuses/Orbits: Globes and oval soft tissues within normal limits. Paranasal sinuses and mastoids are clear. Other: None. ASPECTS [facility] Stroke Program Early CT Score) - Ganglionic level infarction (caudate, lentiform nuclei, internal capsule, insula, M1-M3 cortex): 7 - Supraganglionic infarction (M4-M6 cortex): 3 Total score (0-10 with 10 being normal): 10 IMPRESSION: 1.  No acute intracranial infarct or other process identified. 2. ASPECTS is 10 3. Subcentimeter calcification adjacent to the frontal horn of the right lateral ventricle, similar from 2009. Critical Value/emergent results were called by telephone at the time of interpretation on 10/12/2016 at 3:34 am to Dr. Devoria Albe , who verbally acknowledged these results. Electronically Signed   By: Rise Mu M.D.   On: 10/12/2016 03:39     EKG: Independently reviewed. Sinus rhythm, QTC 482, nonspecific T-wave change.   Assessment/Plan Principal Problem:   Acute encephalopathy Active Problems:   Anxiety   Hypokalemia   Right facial numbness   Right arm weakness   Tobacco abuse   Adderall use disorder, mild, abuse   Acute encephalopathy, right facial numbness, right arm weakness and difficulty finding words: Etiology is not clear. Likely due to overdose of Adderall, but cannot rule out other possibilities, such as TIA/stroke and demyelinating disease. EDP consulted neurology, Dr. Roseanne Reno, who recommends admission to hospitalist service in [facility] b/o adderal ingestion. And need to call back to neurology if needed.  - will place on tele bed for obs - Risk factor modification: HgbA1c, fasting lipid panel - MRI of brain w/ and w/o contrast  - PT consult, OT consult - Bedside swallowing screen was ordered, will get speech consult in AM - will hold off Aspirin now - check Tylenol and salicylate levels  Anxiety: -continue Klonopin, but decreased dose from 1 mg to 0.5 g when necessary twice a day  Hypokalemia: K= 3.3 on admission. - Repleted  Tobacco abuse: -Did counseling about importance of quitting smoking -Nicotine patch  Adderall use disorder, mild, abuse: QTC 482, no ischemic change on EKG. Patient is mildly tachycardia, hemodynamically stable. -IVF: 125 mL/h normal saline -Frequent neuro check   DVT ppx: SQ Lovenox Code Status: Full code Family Communication: None at bed side.      Disposition Plan:  Anticipate discharge back to previous home environment Consults called:  none Admission status: Obs / tele      Date of Service 10/12/2016    Lorretta Harp [facility] Pager [phone]  If 7PM-7AM, please contact night-coverage [email or web address] Password [email or web address] 10/12/2016, 6:08 AM

## 2018-01-18 ENCOUNTER — Emergency Department (HOSPITAL_COMMUNITY)
Admission: EM | Admit: 2018-01-18 | Discharge: 2018-01-18 | Disposition: A | Discharge status: Home / Self Care | Payer: Managed Care, Other (non HMO) | Attending: Emergency Medicine | Admitting: Emergency Medicine

## 2018-01-18 ENCOUNTER — Encounter (HOSPITAL_COMMUNITY): Payer: Self-pay | Admitting: Emergency Medicine

## 2018-01-18 DIAGNOSIS — Z79899 Other long term (current) drug therapy: Secondary | ICD-10-CM | POA: Diagnosis not present

## 2018-01-18 DIAGNOSIS — F17210 Nicotine dependence, cigarettes, uncomplicated: Secondary | ICD-10-CM | POA: Insufficient documentation

## 2018-01-18 DIAGNOSIS — K920 Hematemesis: Secondary | ICD-10-CM | POA: Diagnosis present

## 2018-01-18 DIAGNOSIS — J45909 Unspecified asthma, uncomplicated: Secondary | ICD-10-CM | POA: Insufficient documentation

## 2018-01-18 DIAGNOSIS — R112 Nausea with vomiting, unspecified: Secondary | ICD-10-CM | POA: Diagnosis not present

## 2018-01-18 HISTORY — DX: Unspecified asthma, uncomplicated: J45.909

## 2018-01-18 LAB — CBC WITH DIFFERENTIAL/PLATELET
Basophils Absolute: 0 10*3/uL (ref 0.0–0.1)
Basophils Relative: 0 %
Eosinophils Absolute: 0 10*3/uL (ref 0.0–0.7)
Eosinophils Relative: 0 %
HCT: 42.1 % (ref 39.0–52.0)
Hemoglobin: 15.5 g/dL (ref 13.0–17.0)
Lymphocytes Relative: 20 %
Lymphs Abs: 1.6 10*3/uL (ref 0.7–4.0)
MCH: 32.3 pg (ref 26.0–34.0)
MCHC: 36.8 g/dL — ABNORMAL HIGH (ref 30.0–36.0)
MCV: 87.7 fL (ref 78.0–100.0)
Monocytes Absolute: 0.5 10*3/uL (ref 0.1–1.0)
Monocytes Relative: 7 %
Neutro Abs: 5.9 10*3/uL (ref 1.7–7.7)
Neutrophils Relative %: 73 %
Platelets: 234 10*3/uL (ref 150–400)
RBC: 4.8 MIL/uL (ref 4.22–5.81)
RDW: 11.8 % (ref 11.5–15.5)
WBC: 8.1 10*3/uL (ref 4.0–10.5)

## 2018-01-18 LAB — COMPREHENSIVE METABOLIC PANEL
ALT: 32 U/L (ref 0–44)
AST: 48 U/L — ABNORMAL HIGH (ref 15–41)
Albumin: 5 g/dL (ref 3.5–5.0)
Alkaline Phosphatase: 62 U/L (ref 38–126)
Anion gap: 13 (ref 5–15)
BUN: 13 mg/dL (ref 6–20)
CO2: 28 mmol/L (ref 22–32)
Calcium: 9.6 mg/dL (ref 8.9–10.3)
Chloride: 95 mmol/L — ABNORMAL LOW (ref 98–111)
Creatinine, Ser: 0.93 mg/dL (ref 0.61–1.24)
GFR calc Af Amer: >60 mL/min (ref >60)
GFR calc non Af Amer: >60 mL/min (ref >60)
Glucose, Bld: 94 mg/dL (ref 70–99)
Potassium: 3.4 mmol/L — ABNORMAL LOW (ref 3.5–5.1)
Sodium: 136 mmol/L (ref 135–145)
Total Bilirubin: 1.3 mg/dL — ABNORMAL HIGH (ref 0.3–1.2)
Total Protein: 7.8 g/dL (ref 6.5–8.1)

## 2018-01-18 LAB — LIPASE, BLOOD: Lipase: 28 U/L (ref 11–51)

## 2018-01-18 NOTE — ED Provider Notes (Signed)
[facility]-EMERGENCY DEPT Provider Note   CSN: [id number] Arrival date & time: 01/18/18  1242     History   Chief Complaint Chief Complaint  Patient presents with  . coughing up blood    HPI Barbette ReichmannDaniel J Cotterill is a 32 y.o. male.  HPI  32 year old male with nausea and hematemesis.  Patient states that he was drinking beer last night and also took some morphine that his friend gave him.  He began to feel anxious after taking it because he took two sedating medications.  He thought it would be a good idea to then take aspirin.  He subsequently vomited and noticed some blood in it.  After he drinks water he vomited again which still had streaks of blood.  Currently feeling somewhat nauseated but improved from prior.  Denies any pain.  No blood in stool or melena.  He is not anticoagulated.  Past Medical History:  Diagnosis Date  . Anxiety   . Anxiety   . Asthma   . Tobacco abuse     Patient Active Problem List   Diagnosis Date Noted  . Acute encephalopathy 10/12/2016  . Hypokalemia 10/12/2016  . Right facial numbness 10/12/2016  . Right arm weakness 10/12/2016  . Anxiety   . Tobacco abuse   . Adderall use disorder, mild, abuse (HCC)     History reviewed. No pertinent surgical history.      Home Medications    Prior to Admission medications   Medication Sig Start Date End Date Taking? Authorizing Provider  clonazePAM (KLONOPIN) 0.5 MG tablet Take 0.5 mg by mouth 2 (two) times daily as needed for anxiety.  12/26/17  Yes [provider]  Multiple Vitamins-Minerals (MULTIVITAMIN ADULTS) TABS Take 1 tablet by mouth daily.   Yes [provider]  ondansetron (ZOFRAN) 8 MG tablet Take 8 mg by mouth every 8 (eight) hours as needed for nausea or vomiting.   Yes [provider]  albuterol (PROVENTIL HFA;VENTOLIN HFA) 108 (90 Base) MCG/ACT inhaler Inhale 1-2 puffs into the lungs every 6 (six) hours as needed for wheezing or shortness of  breath.    [provider]    Family History Family History  Problem Relation Age of Onset  . Thyroid cancer Mother   . Hypertension Father     Social History Social History   Tobacco Use  . Smoking status: Current Some Day Smoker  . Smokeless tobacco: Never Used  Substance Use Topics  . Alcohol use: Yes    Comment: social  . Drug use: Yes     Allergies   Patient has no known allergies.   Review of Systems Review of Systems  All systems reviewed and negative, other than as noted in HPI.  Physical Exam Updated Vital Signs BP 136/73   Pulse (!) 59   Temp 98.8 F (37.1 C) (Oral)   Resp 18   Wt 79.4 kg (175 lb)   SpO2 98%   BMI 25.84 kg/m   Physical Exam  Constitutional: He appears well-developed and well-nourished. No distress.  HENT:  Head: Normocephalic and atraumatic.  Eyes: Conjunctivae are normal. Right eye exhibits no discharge. Left eye exhibits no discharge.  Neck: Neck supple.  Cardiovascular: Normal rate, regular rhythm and normal heart sounds. Exam reveals no gallop and no friction rub.  No murmur heard. Pulmonary/Chest: Effort normal and breath sounds normal. No respiratory distress.  Abdominal: Soft. He exhibits no distension. There is no tenderness.  Musculoskeletal: He exhibits no edema  or tenderness.  Neurological: He is alert.  Skin: Skin is warm and dry.  Psychiatric: He has a normal mood and affect. His behavior is normal. Thought content normal.  Nursing note and vitals reviewed.    ED Treatments / Results  Labs (all labs ordered are listed, but only abnormal results are displayed) Labs Reviewed  COMPREHENSIVE METABOLIC PANEL - Abnormal; Notable for the following components:      Result Value   Potassium 3.4 (*)    Chloride 95 (*)    AST 48 (*)    Total Bilirubin 1.3 (*)    All other components within normal limits  CBC WITH DIFFERENTIAL/PLATELET - Abnormal; Notable for the following components:   MCHC 36.8 (*)     All other components within normal limits  LIPASE, BLOOD    EKG None  Radiology No results found.  Procedures Procedures (including critical care time)  Medications Ordered in ED Medications - No data to display   Initial Impression / Assessment and Plan / ED Course  I have reviewed the triage vital signs and the nursing notes.  Pertinent labs & imaging results that were available during my care of the patient were reviewed by me and considered in my medical decision making (see chart for details).     32 year old male with polysubstance abuse with recent hematemesis.  Not abdominal exam.  Suspect this may be a Mallory-Weiss tear.  I doubt hemodynamically significant GI bleed.  Patient was cautioned in terms of general return precautions as well as substance abuse. Final Clinical Impressions(s) / ED Diagnoses   Final diagnoses:  Nausea and vomiting, intractability of vomiting not specified, unspecified vomiting type  Hematemesis, presence of nausea not specified    ED Discharge Orders    None       Raeford Razor, MD 01/25/18 1721

## 2018-01-18 NOTE — ED Triage Notes (Signed)
Per pt, states he was drinking last night and took a morphine pill his friend gave him-states he got anxious about mixing the beer with the pill-took 3 ASA and a couple of hours took another 3 of ASA-states he coughed up a little blood then drank water and coughed up some more-some nausea, no pain-

## 2018-12-22 ENCOUNTER — Emergency Department (HOSPITAL_COMMUNITY)
Admission: EM | Admit: 2018-12-22 | Discharge: 2018-12-22 | Disposition: A | Discharge status: Home / Self Care | Payer: Managed Care, Other (non HMO) | Attending: Emergency Medicine | Admitting: Emergency Medicine

## 2018-12-22 ENCOUNTER — Ambulatory Visit (HOSPITAL_COMMUNITY): Payer: Self-pay

## 2018-12-22 ENCOUNTER — Other Ambulatory Visit: Payer: Self-pay

## 2018-12-22 DIAGNOSIS — F419 Anxiety disorder, unspecified: Secondary | ICD-10-CM | POA: Diagnosis not present

## 2018-12-22 DIAGNOSIS — Z79899 Other long term (current) drug therapy: Secondary | ICD-10-CM | POA: Diagnosis not present

## 2018-12-22 DIAGNOSIS — T50901A Poisoning by unspecified drugs, medicaments and biological substances, accidental (unintentional), initial encounter: Secondary | ICD-10-CM | POA: Insufficient documentation

## 2018-12-22 DIAGNOSIS — E876 Hypokalemia: Secondary | ICD-10-CM | POA: Diagnosis not present

## 2018-12-22 DIAGNOSIS — F17200 Nicotine dependence, unspecified, uncomplicated: Secondary | ICD-10-CM | POA: Diagnosis not present

## 2018-12-22 DIAGNOSIS — R0602 Shortness of breath: Secondary | ICD-10-CM | POA: Diagnosis present

## 2018-12-22 DIAGNOSIS — J45909 Unspecified asthma, uncomplicated: Secondary | ICD-10-CM | POA: Diagnosis not present

## 2018-12-22 LAB — RAPID URINE DRUG SCREEN, HOSP PERFORMED
Amphetamines: POSITIVE — AB
Barbiturates: NOT DETECTED
Benzodiazepines: NOT DETECTED
Cocaine: NOT DETECTED
Opiates: NOT DETECTED
Tetrahydrocannabinol: NOT DETECTED

## 2018-12-22 LAB — CBC
HCT: 45.4 % (ref 39.0–52.0)
Hemoglobin: 15.9 g/dL (ref 13.0–17.0)
MCH: 31.7 pg (ref 26.0–34.0)
MCHC: 35 g/dL (ref 30.0–36.0)
MCV: 90.4 fL (ref 80.0–100.0)
Platelets: 142 10*3/uL — ABNORMAL LOW (ref 150–400)
RBC: 5.02 MIL/uL (ref 4.22–5.81)
RDW: 12.1 % (ref 11.5–15.5)
WBC: 10.7 10*3/uL — ABNORMAL HIGH (ref 4.0–10.5)
nRBC: 0 % (ref 0.0–0.2)

## 2018-12-22 LAB — COMPREHENSIVE METABOLIC PANEL
ALT: 64 U/L — ABNORMAL HIGH (ref 0–44)
AST: 50 U/L — ABNORMAL HIGH (ref 15–41)
Albumin: 4.5 g/dL (ref 3.5–5.0)
Alkaline Phosphatase: 69 U/L (ref 38–126)
Anion gap: 16 — ABNORMAL HIGH (ref 5–15)
BUN: 14 mg/dL (ref 6–20)
CO2: 24 mmol/L (ref 22–32)
Calcium: 9.2 mg/dL (ref 8.9–10.3)
Chloride: 97 mmol/L — ABNORMAL LOW (ref 98–111)
Creatinine, Ser: 1.05 mg/dL (ref 0.61–1.24)
GFR calc Af Amer: >60 mL/min (ref >60)
GFR calc non Af Amer: >60 mL/min (ref >60)
Glucose, Bld: 109 mg/dL — ABNORMAL HIGH (ref 70–99)
Potassium: 3 mmol/L — ABNORMAL LOW (ref 3.5–5.1)
Sodium: 137 mmol/L (ref 135–145)
Total Bilirubin: 0.9 mg/dL (ref 0.3–1.2)
Total Protein: 7.5 g/dL (ref 6.5–8.1)

## 2018-12-22 LAB — ACETAMINOPHEN LEVEL: Acetaminophen (Tylenol), Serum: <10 ug/mL — ABNORMAL LOW (ref 10–30)

## 2018-12-22 LAB — SALICYLATE LEVEL: Salicylate Lvl: 11 mg/dL (ref 2.8–30.0)

## 2018-12-22 LAB — ETHANOL: Alcohol, Ethyl (B): <10 mg/dL (ref <10)

## 2018-12-22 LAB — CK: Total CK: 363 U/L (ref 49–397)

## 2018-12-22 MED ORDER — SODIUM CHLORIDE 0.9 % IV BOLUS (SEPSIS)
1000.0000 mL | Freq: Once | INTRAVENOUS | Status: AC
Start: 2018-12-22 — End: 2018-12-22
  Administered 2018-12-22: 1000 mL via INTRAVENOUS

## 2018-12-22 MED ORDER — POTASSIUM CHLORIDE CRYS ER 20 MEQ PO TBCR
40.0000 meq | EXTENDED_RELEASE_TABLET | Freq: Once | ORAL | Status: AC
  Administered 2018-12-22: 05:00:00 40 meq via ORAL
  Filled 2018-12-22: qty 2

## 2018-12-22 MED ORDER — SODIUM CHLORIDE 0.9 % IV BOLUS (SEPSIS)
1000.0000 mL | Freq: Once | INTRAVENOUS | Status: AC
  Administered 2018-12-22: 05:00:00 1000 mL via INTRAVENOUS

## 2018-12-22 MED ORDER — LORAZEPAM 2 MG/ML IJ SOLN
1.0000 mg | Freq: Once | INTRAMUSCULAR | Status: AC
  Administered 2018-12-22: 1 mg via INTRAVENOUS
  Filled 2018-12-22: qty 1

## 2018-12-22 NOTE — ED Triage Notes (Signed)
Per EMS - Come from home per pt, pt took 15 (20 mg instant release) adderall from 10am - 8pm. Now complaining of right sided abnormal sensation that he can not explain. Also too 1 (325mg )aspirin and 8 (0.5 mg) klonopin. All pills were prescribed to pt, pt admits being addicted to adderall. "Denies SI. Recreational use only now feels weird"    12 lead - inverted T waves + ST  90% on RA - 98% on 4 L  160/110 130 HR

## 2018-12-22 NOTE — ED Provider Notes (Signed)
[facility] DEPT Provider Note   CSN: [id number] Arrival date & time: 12/22/18  0344     History   Chief Complaint Chief Complaint  Patient presents with  . Drug Overdose    HPI Randy Ortiz is a 33 y.o. male.     The history is provided by the patient.  Drug Overdose This is a new problem. The current episode started 12 to 24 hours ago. The problem occurs constantly. The problem has been gradually worsening. Associated symptoms include shortness of breath. Pertinent negatives include no chest pain and no abdominal pain. Nothing aggravates the symptoms. Nothing relieves the symptoms.  Patient with history of anxiety, asthma presents with accidental overdose.  He reports he was using Adderall for recreational purposes.  He denies SI.  He reports taking up to 15 tablets of Adderall over a 6-hour.  He reports he started feeling shortness of breath and palpitations.  He then took one-time dose of aspirin and 8 Klonopin  Patient reports he now feels weird.  He reports having an abnormal sensation in his body.   Past Medical History:  Diagnosis Date  . Anxiety   . Anxiety   . Asthma   . Tobacco abuse     Patient Active Problem List   Diagnosis Date Noted  . Acute encephalopathy 10/12/2016  . Hypokalemia 10/12/2016  . Right facial numbness 10/12/2016  . Right arm weakness 10/12/2016  . Anxiety   . Tobacco abuse   . 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      Home Medications    Prior to Admission medications   Medication Sig Start Date End Date Taking? Authorizing Provider  albuterol (PROVENTIL HFA;VENTOLIN HFA) 108 (90 Base) MCG/ACT inhaler Inhale 1-2 puffs into the lungs every 6 (six) hours as needed for wheezing or shortness of breath.    [provider]  clonazePAM (KLONOPIN) 0.5 MG tablet Take 0.5 mg by mouth 2 (two) times daily as needed for anxiety.  12/26/17   [provider]  Multiple Vitamins-Minerals (MULTIVITAMIN ADULTS) TABS Take 1 tablet by mouth daily.    [provider]  ondansetron (ZOFRAN) 8 MG tablet Take 8 mg by mouth every 8 (eight) hours as needed for nausea or vomiting.    [provider]    Family History Family History  Problem Relation Age of Onset  . Thyroid cancer Mother   . Hypertension Father     Social History Social History   Tobacco Use  . Smoking status: Current Some Day Smoker  . Smokeless tobacco: Never Used  Substance Use Topics  . Alcohol use: Yes    Comment: social  . Drug use: Yes     Allergies   Patient has no known allergies.   Review of Systems Review of Systems  Constitutional: Negative for fever.  Respiratory: Positive for shortness of breath.   Cardiovascular: Positive for palpitations. Negative for chest pain.  Gastrointestinal: Negative for abdominal pain.  Psychiatric/Behavioral: Negative for suicidal ideas.  All other systems reviewed and are negative.    Physical Exam Updated Vital Signs BP (!) 165/145   Pulse (!) 112   Temp 98.6 F (37 C) (Oral)   Resp 18   Ht 1.753 m (5\' 9" )   Wt 81.6 kg   SpO2 95%   BMI 26.58 kg/m   Physical Exam CONSTITUTIONAL: Disheveled, flat affect HEAD: Normocephalic/atraumatic EYES: EOMI/PERRL, pupils dilated ENMT: Mucous membranes  moist NECK: supple no meningeal signs SPINE/BACK:entire spine nontender CV: S1/S2 noted, no murmurs/rubs/gallops noted, tachycardic LUNGS: Lungs are clear to auscultation bilaterally, no apparent distress ABDOMEN: soft, nontender, no rebound or guarding, bowel sounds noted throughout abdomen GU:no cva tenderness NEURO: Pt is awake/alert/appropriate, moves all extremitiesx4.  No facial droop.   EXTREMITIES: pulses normal/equal, full ROM SKIN: warm, color normal PSYCH: Mildly anxious  ED Treatments / Results  Labs (all labs ordered are listed, but only abnormal results are displayed) Labs Reviewed   COMPREHENSIVE METABOLIC PANEL - Abnormal; Notable for the following components:      Result Value   Potassium 3.0 (*)    Chloride 97 (*)    Glucose, Bld 109 (*)    AST 50 (*)    ALT 64 (*)    Anion gap 16 (*)    All other components within normal limits  ACETAMINOPHEN LEVEL - Abnormal; Notable for the following components:   Acetaminophen (Tylenol), Serum <10 (*)    All other components within normal limits  RAPID URINE DRUG SCREEN, HOSP PERFORMED - Abnormal; Notable for the following components:   Amphetamines POSITIVE (*)    All other components within normal limits  CBC - Abnormal; Notable for the following components:   WBC 10.7 (*)    Platelets 142 (*)    All other components within normal limits  ETHANOL  SALICYLATE LEVEL  CK    EKG EKG Interpretation  Date/Time:  Sunday December 22 2018 04:01:26 EDT Ventricular Rate:  116 PR Interval:    QRS Duration: 91 QT Interval:  326 QTC Calculation: 453 R Axis:   37 Text Interpretation:  Sinus tachycardia Borderline T abnormalities, inferior leads No significant change since last tracing Confirmed by Zadie RhineWickline, Chance Munter ([id number]) on 12/22/2018 4:24:46 AM   Radiology No results found.  Procedures .Critical Care Performed by: Zadie RhineWickline, Zaynah Chawla, MD Authorized by: Zadie RhineWickline, Aya Geisel, MD   Critical care provider statement:    Critical care time (minutes):  60   Critical care start time:  12/22/2018 4:30 AM   Critical care end time:  12/22/2018 5:30 AM   Critical care time was exclusive of:  Separately billable procedures and treating other patients   Critical care was necessary to treat or prevent imminent or life-threatening deterioration of the following conditions:  CNS failure or compromise, toxidrome, dehydration and circulatory failure   Critical care was time spent personally by me on the following activities:  Development of treatment plan with patient or surrogate, evaluation of patient's response to treatment, examination of  patient, re-evaluation of patient's condition, pulse oximetry, review of old charts, ordering and review of laboratory studies and ordering and performing treatments and interventions     Medications Ordered in ED Medications  LORazepam (ATIVAN) injection 1 mg (1 mg Intravenous Given 12/22/18 0421)  sodium chloride 0.9 % bolus 1,000 mL (0 mLs Intravenous Stopped 12/22/18 0546)  sodium chloride 0.9 % bolus 1,000 mL (0 mLs Intravenous Stopped 12/22/18 0621)  potassium chloride SA (K-DUR) CR tablet 40 mEq (40 mEq Oral Given 12/22/18 0527)     Initial Impression / Assessment and Plan / ED Course  I have reviewed the triage vital signs and the nursing notes.  Pertinent lab results that were available during my care of the patient were reviewed by me and considered in my medical decision making (see chart for details).        4:43 AM Patient presented for recreational overdose of Adderall.  He denies SI.  He has  been seen in outside hospitals for similar episode.  IV fluids and Ativan ordered.  Labs are pending at this time 5:44 AM Labs are overall reassuring.  Heart rate is improving.  Patient has received 2 L of IV fluid 6:24 AM Per poison control recommendation, patient need to monitor for 6 hours. Overall patient does appear to be improving. 6:57 AM Signed out to dr Denton Lanksteinl, monitor till 9:30am  Final Clinical Impressions(s) / ED Diagnoses   Final diagnoses:  Accidental drug overdose, initial encounter  Hypokalemia    ED Discharge Orders    None       Zadie RhineWickline, Antrice Pal, MD 12/22/18 [phone]

## 2018-12-22 NOTE — ED Provider Notes (Signed)
Signed out by Dr Christy Gentles to observe in ED until 0930, and then dispo to home, no SI.  Recheck pt 1020, pt awake and alert. Vital signs are normal. Room air pulse ox currently 96%. Hr 80's. bp normal. Afebrile.   Pt exhibits normal mood and affect. He denies any thoughts of harm to self or others. No SI.   Pt currently appears stable for d/c.   Rec outpt pcp/bh f/u.  Return precautions provided.      Lajean Saver, MD 12/22/18 1031

## 2018-12-22 NOTE — Discharge Instructions (Addendum)
It was our pleasure to provide your ER care today - we hope that you feel better.  It is very important that you never take any medication in above the prescribed or recommended dose.   From today's lab tests, your potassium level is low (3) - eat plenty of fruits and vegetables, and follow up with primary care doctor for recheck in 1 week.   For mental health issues, follow up with primary care doctor this week. For mental health issues/crisis, you may also go directly to [facility].  Return to ER if worse, new symptoms, fevers, trouble breathing, or other concern.

## 2018-12-22 NOTE — Patient Outreach (Signed)
CPSS met with the patient in order to provide substance use recovery support and help with getting connected to substance use treatment resources. Patient arrived to the [facility] for an accidental drug overdose. Patient reports using Adderall and Klonopin yesterday. Patient denies an active history of misusing both of these substances on a regular basis. Patient plans to stop using his Adderall prescription recreationally after this ED visit and plans to follow up with his therapist. Patient denied wanting help at this time getting connected to an outpatient or residential substance use treatment center. CPSS still provided information for substance use recovery resources including residential/outpatient substance use treatment center list, NA meeting list, and CPSS contact information. CPSS strongly encouraged the patient to follow up with CPSS if needed for further help with getting connected to substance use treatment resources after discharge from the [facility].

## 2018-12-22 NOTE — ED Notes (Signed)
RN has spoken to [employer] and that they are closing his case and that patient is safe to be discharged per [employer] guidelines and policies.

## 2018-12-22 NOTE — ED Notes (Signed)
Peer support meeting with patient now.

## 2018-12-22 NOTE — ED Notes (Signed)
Bed: RF54 Expected date:  Expected time:  Means of arrival:  Comments: 26M took 15 Adderal

## 2019-10-15 ENCOUNTER — Other Ambulatory Visit: Payer: Self-pay

## 2019-10-15 ENCOUNTER — Encounter (HOSPITAL_COMMUNITY): Payer: Self-pay | Admitting: Emergency Medicine

## 2019-10-15 ENCOUNTER — Emergency Department (HOSPITAL_COMMUNITY)
Admission: EM | Admit: 2019-10-15 | Discharge: 2019-10-15 | Disposition: A | Discharge status: Home / Self Care | Payer: Managed Care, Other (non HMO) | Attending: Emergency Medicine | Admitting: Emergency Medicine

## 2019-10-15 ENCOUNTER — Emergency Department (HOSPITAL_COMMUNITY): Payer: Managed Care, Other (non HMO)

## 2019-10-15 DIAGNOSIS — Z79899 Other long term (current) drug therapy: Secondary | ICD-10-CM | POA: Diagnosis not present

## 2019-10-15 DIAGNOSIS — R202 Paresthesia of skin: Secondary | ICD-10-CM | POA: Diagnosis not present

## 2019-10-15 DIAGNOSIS — F419 Anxiety disorder, unspecified: Secondary | ICD-10-CM | POA: Diagnosis not present

## 2019-10-15 DIAGNOSIS — R0602 Shortness of breath: Secondary | ICD-10-CM | POA: Diagnosis present

## 2019-10-15 DIAGNOSIS — F17210 Nicotine dependence, cigarettes, uncomplicated: Secondary | ICD-10-CM | POA: Diagnosis not present

## 2019-10-15 MED ORDER — CLONAZEPAM 0.5 MG PO TABS: 0.5000 mg | ORAL_TABLET | Freq: Two times a day (BID) | ORAL | 0 refills | Status: DC

## 2019-10-15 MED ORDER — CLONAZEPAM 0.5 MG PO TABS
0.5000 mg | ORAL_TABLET | Freq: Once | ORAL | Status: AC
  Administered 2019-10-15: 0.5 mg via ORAL
  Filled 2019-10-15: qty 1

## 2019-10-15 MED ORDER — ALBUTEROL SULFATE (2.5 MG/3ML) 0.083% IN NEBU: 5.0000 mg | INHALATION_SOLUTION | Freq: Once | RESPIRATORY_TRACT | Status: DC

## 2019-10-15 NOTE — ED Provider Notes (Signed)
[facility] Provider Note   CSN: [id number] Arrival date & time: 10/15/19  1802     History Chief Complaint  Patient presents with  . Shortness of Breath    Randy Ortiz is a 34 y.o. male.  Patient with history of anxiety in which he takes chronic benzodiazepines who presents to the ED with shortness of breath, left hand tingling.  Patient states that he ran out of his clonazepam as he had to take some extra doses this month due to worsening anxiety.  Took a Valium this morning from his mother that helped his symptoms.  He has had poor sleep, heart fluttering, nervousness, shortness of breath.  Patient states that he had about 30 seconds of his left hand feeling weak but no other neurological symptoms.  At this time he is asymptomatic.  He was unable to get a new prescription from his primary care doctor today as he is hoping to get 2-day prescription as he is able to get his new 30-day prescription on 24th.  The history is provided by the patient.  Illness Location:  General Severity:  Mild Onset quality:  Gradual Timing:  Intermittent Progression:  Waxing and waning Chronicity:  Recurrent Associated symptoms: shortness of breath   Associated symptoms: no abdominal pain, no chest pain, no cough, no ear pain, no fever, no rash, no sore throat and no vomiting        Past Medical History:  Diagnosis Date  . Anxiety   . Anxiety   . Asthma   . Tobacco abuse     Patient Active Problem List   Diagnosis Date Noted  . Acute encephalopathy 10/12/2016  . Hypokalemia 10/12/2016  . Right facial numbness 10/12/2016  . Right arm weakness 10/12/2016  . Anxiety   . Tobacco abuse   . 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 No pertinent surgical history.     Family History  Problem Relation Age of Onset  . Thyroid cancer Mother   . Hypertension Father     Social History   Tobacco Use  . Smoking status: Current  Some Day Smoker  . Smokeless tobacco: Never Used  Substance Use Topics  . Alcohol use: Yes    Comment: social  . Drug use: Yes    Home Medications Prior to Admission medications   Medication Sig Start Date End Date Taking? Authorizing Provider  albuterol (PROVENTIL HFA;VENTOLIN HFA) 108 (90 Base) MCG/ACT inhaler Inhale 1-2 puffs into the lungs every 6 (six) hours as needed for wheezing or shortness of breath.    [provider]  amphetamine-dextroamphetamine (ADDERALL) 20 MG tablet Take 20 mg by mouth 2 (two) times daily. 11/18/18   [provider]  clonazePAM (KLONOPIN) 0.5 MG tablet Take 1 tablet (0.5 mg total) by mouth 2 (two) times daily for 2 days. 10/15/19 10/17/19  Syncere Kaminski, DO  Multiple Vitamins-Minerals (MULTIVITAMIN ADULTS) TABS Take 1 tablet by mouth daily.    [provider]    Allergies    Amphetamine-dextroamphetamine  Review of Systems   Review of Systems  Constitutional: Negative for chills and fever.  HENT: Negative for ear pain and sore throat.   Eyes: Negative for pain and visual disturbance.  Respiratory: Positive for shortness of breath. Negative for cough.   Cardiovascular: Negative for chest pain and palpitations.  Gastrointestinal: Negative for abdominal pain and vomiting.  Genitourinary: Negative for dysuria and hematuria.  Musculoskeletal: Negative for arthralgias and back  pain.  Skin: Negative for color change and rash.  Neurological: Positive for weakness. Negative for seizures and syncope.  Psychiatric/Behavioral: The patient is nervous/anxious.   All other systems reviewed and are negative.   Physical Exam Updated Vital Signs  ED Triage Vitals  Enc Vitals Group     BP 10/15/19 1825 (!) 164/99     Pulse Rate 10/15/19 1825 98     Resp 10/15/19 1825 18     Temp 10/15/19 1825 98 F (36.7 C)     Temp Source 10/15/19 1825 Oral     SpO2 10/15/19 1825 98 %     Weight --      Height --      Head Circumference --       Peak Flow --      Pain Score 10/15/19 1830 0     Pain Loc --      Pain Edu? --      Excl. in GC? --     Physical Exam Vitals and nursing note reviewed.  Constitutional:      General: He is not in acute distress.    Appearance: He is well-developed. He is not ill-appearing.  HENT:     Head: Normocephalic and atraumatic.  Eyes:     Conjunctiva/sclera: Conjunctivae normal.     Pupils: Pupils are equal, round, and reactive to light.  Cardiovascular:     Rate and Rhythm: Normal rate and regular rhythm.     Pulses: Normal pulses.     Heart sounds: Normal heart sounds. No murmur.  Pulmonary:     Effort: Pulmonary effort is normal. No respiratory distress.     Breath sounds: Normal breath sounds.  Abdominal:     Palpations: Abdomen is soft.     Tenderness: There is no abdominal tenderness.  Musculoskeletal:        General: Normal range of motion.     Cervical back: Normal range of motion and neck supple.  Skin:    General: Skin is warm and dry.  Neurological:     General: No focal deficit present.     Mental Status: He is alert and oriented to person, place, and time.     Cranial Nerves: No cranial nerve deficit.     Motor: No weakness.     Comments: 5+ out of 5 strength throughout, normal sensation, no drift, normal speech, normal finger-to-nose finger  Psychiatric:        Mood and Affect: Mood normal. Mood is not anxious.        Behavior: Behavior normal. Behavior is not agitated.     ED Results / Procedures / Treatments   Labs (all labs ordered are listed, but only abnormal results are displayed) Labs Reviewed - No data to display  EKG EKG Interpretation  Date/Time:  Wednesday October 15 2019 18:24:35 EDT Ventricular Rate:  90 PR Interval:    QRS Duration: 89 QT Interval:  357 QTC Calculation: 437 R Axis:   73 Text Interpretation: Sinus rhythm No significant change since last tracing Confirmed by Virgina Norfolk [phone]) on 10/15/2019 7:03:16 PM   Radiology DG  Chest 2 View  Result Date: 10/15/2019 CLINICAL DATA:  Shortness of breath EXAM: CHEST - 2 VIEW COMPARISON:  None. FINDINGS: Heart and mediastinal contours are within normal limits. No focal opacities or effusions. No acute bony abnormality. IMPRESSION: Normal study. Electronically Signed   By: Charlett Nose M.D.   On: 10/15/2019 19:07    Procedures Procedures (including critical  care time)  Medications Ordered in ED Medications  clonazePAM (KLONOPIN) tablet 0.5 mg (has no administration in time range)    ED Course  I have reviewed the triage vital signs and the nursing notes.  Pertinent labs & imaging results that were available during my care of the patient were reviewed by me and considered in my medical decision making (see chart for details).    MDM Rules/Calculators/A&P                      SALIH WILLIAMSON is a 33 year old male with history of anxiety who presents to the ED with panic attack type symptoms.  Patient with overall unremarkable vitals.  No fever.  States that he gets chronic clonazepam prescription for his anxiety but due to worsening anxiety this past month his prescription ran out prior to him being able to fill his new 30-day prescription.  He has had poor sleep, shortness of breath, sweating, tingling.  Patient states that his left hand felt weak for about 30 seconds today.  However he is currently asymptomatic now after taking a Valium from his mother.  Patient overall is neurologically intact.  No concern for stroke or other acute process.  Triage chest x-ray shows no infectious process in his lungs.  EKG shows sinus rhythm.  Patient given dose of Klonopin while in the ED.  Will prescribe 2-day prescription.  Upon medication review patient does get consistent benzodiazepine prescriptions and is supposed to have new script in 2 days.  Did not want patient to withdraw.  Discharged in good condition.  This chart was dictated using voice recognition software.  Despite best  efforts to proofread,  errors can occur which can change the documentation meaning.    Final Clinical Impression(s) / ED Diagnoses Final diagnoses:  Anxiety    Rx / DC Orders ED Discharge Orders         Ordered    clonazePAM (KLONOPIN) 0.5 MG tablet  2 times daily     10/15/19 1918           Virgina Norfolk, DO 10/15/19 1918

## 2019-10-15 NOTE — ED Triage Notes (Signed)
C/o sob for couple days. Reports when woke up today his left arm weak. Reports tried driving with left hand and entire body felt burning sensation and felt faint about 1 hour ago. No neuro deficits noted at this time in triage.

## 2020-06-09 IMAGING — CR DG CHEST 2V
2 series · 2 of 2 positions shown · non-contrast
Comparison: None.

CLINICAL DATA: Shortness of breath

EXAM:
CHEST - 2 VIEW

[w chest pa]
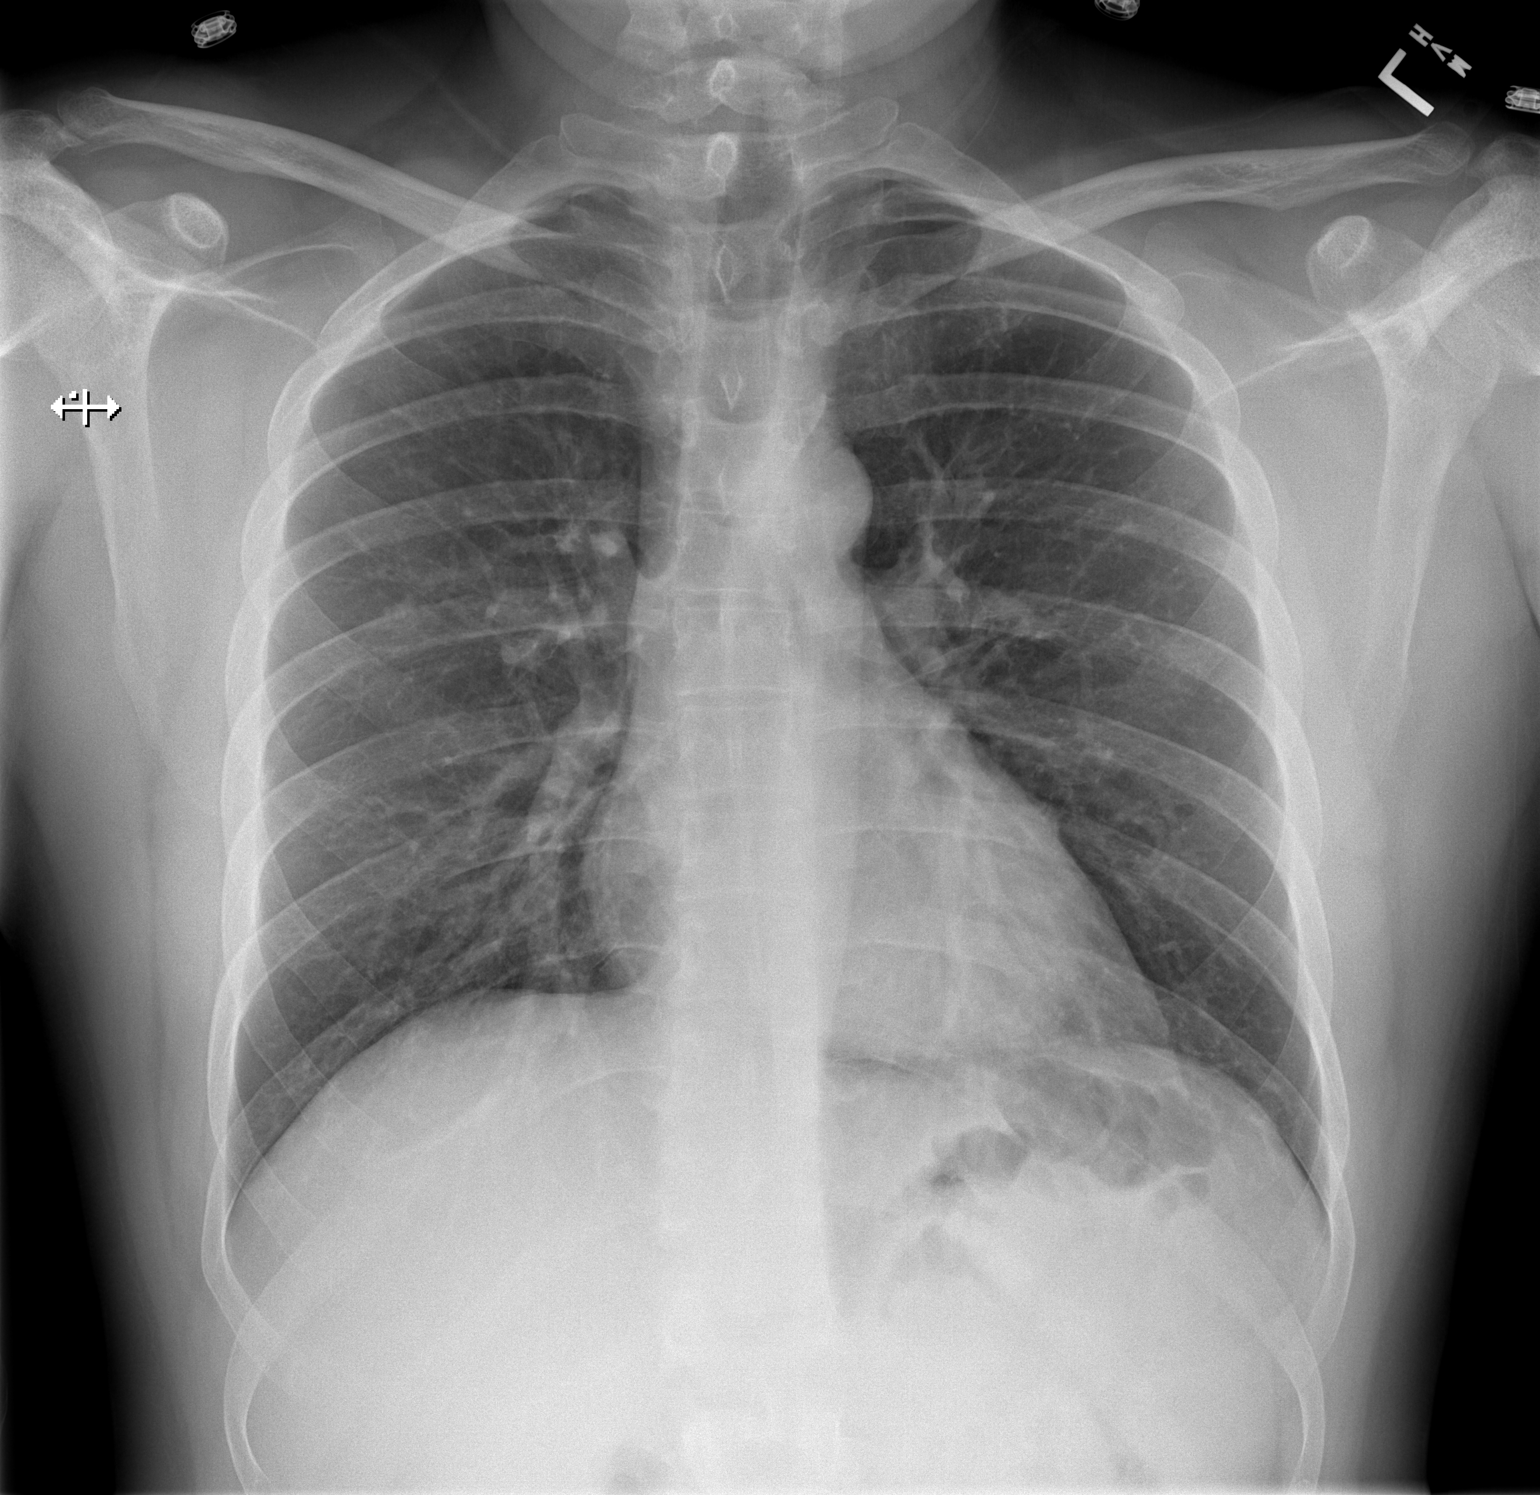

[w chest lat]
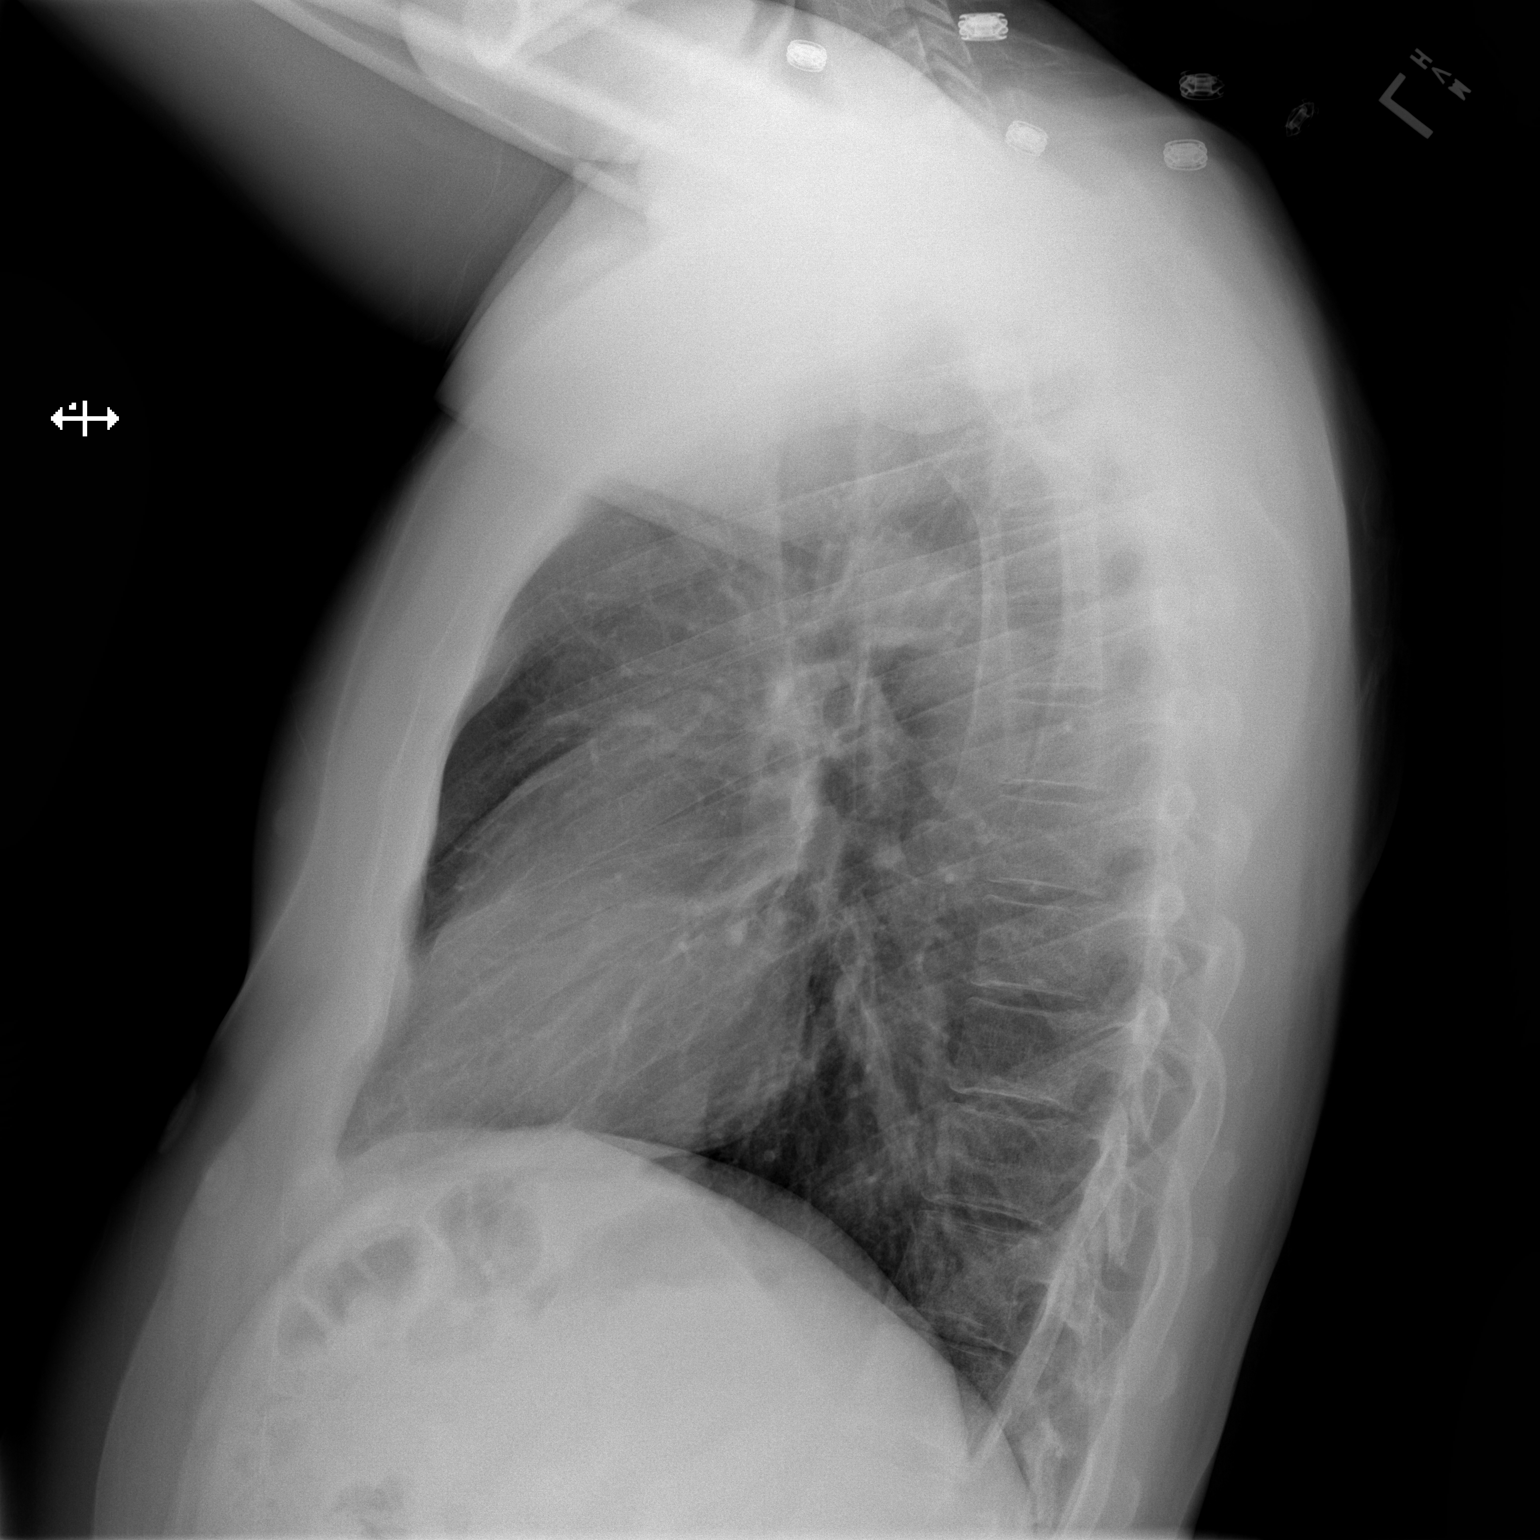

[2 of 2 positions shown; findings below may reference images not displayed]

FINDINGS: Heart and mediastinal contours are within normal limits. No focal
opacities or effusions. No acute bony abnormality.
IMPRESSION: Normal study.

## 2021-11-30 ENCOUNTER — Other Ambulatory Visit: Payer: Self-pay

## 2021-11-30 ENCOUNTER — Emergency Department (HOSPITAL_COMMUNITY): Payer: 59

## 2021-11-30 ENCOUNTER — Encounter (HOSPITAL_COMMUNITY): Payer: Self-pay

## 2021-11-30 ENCOUNTER — Emergency Department (HOSPITAL_COMMUNITY)
Admission: EM | Admit: 2021-11-30 | Discharge: 2021-12-01 | Disposition: A | Discharge status: Home / Self Care | Payer: 59 | Attending: Emergency Medicine | Admitting: Emergency Medicine

## 2021-11-30 DIAGNOSIS — R079 Chest pain, unspecified: Secondary | ICD-10-CM | POA: Diagnosis not present

## 2021-11-30 DIAGNOSIS — Z859 Personal history of malignant neoplasm, unspecified: Secondary | ICD-10-CM | POA: Insufficient documentation

## 2021-11-30 DIAGNOSIS — Z87891 Personal history of nicotine dependence: Secondary | ICD-10-CM | POA: Diagnosis not present

## 2021-11-30 DIAGNOSIS — J45909 Unspecified asthma, uncomplicated: Secondary | ICD-10-CM | POA: Insufficient documentation

## 2021-11-30 DIAGNOSIS — Z7951 Long term (current) use of inhaled steroids: Secondary | ICD-10-CM | POA: Insufficient documentation

## 2021-11-30 DIAGNOSIS — F1990 Other psychoactive substance use, unspecified, uncomplicated: Secondary | ICD-10-CM

## 2021-11-30 LAB — BASIC METABOLIC PANEL
Anion gap: 12 (ref 5–15)
BUN: 14 mg/dL (ref 6–20)
CO2: 22 mmol/L (ref 22–32)
Calcium: 9.7 mg/dL (ref 8.9–10.3)
Chloride: 105 mmol/L (ref 98–111)
Creatinine, Ser: 1.18 mg/dL (ref 0.61–1.24)
GFR, Estimated: >60 mL/min (ref >60)
Glucose, Bld: 119 mg/dL — ABNORMAL HIGH (ref 70–99)
Potassium: 3.7 mmol/L (ref 3.5–5.1)
Sodium: 139 mmol/L (ref 135–145)

## 2021-11-30 LAB — TROPONIN I (HIGH SENSITIVITY): Troponin I (High Sensitivity): 3 ng/L (ref <18)

## 2021-11-30 LAB — CBC
HCT: 47.5 % (ref 39.0–52.0)
Hemoglobin: 17.4 g/dL — ABNORMAL HIGH (ref 13.0–17.0)
MCH: 32.8 pg (ref 26.0–34.0)
MCHC: 36.6 g/dL — ABNORMAL HIGH (ref 30.0–36.0)
MCV: 89.6 fL (ref 80.0–100.0)
Platelets: 234 10*3/uL (ref 150–400)
RBC: 5.3 MIL/uL (ref 4.22–5.81)
RDW: 12.5 % (ref 11.5–15.5)
WBC: 9.6 10*3/uL (ref 4.0–10.5)
nRBC: 0 % (ref 0.0–0.2)

## 2021-11-30 MED ORDER — SODIUM CHLORIDE 0.9 % IV BOLUS
1500.0000 mL | Freq: Once | INTRAVENOUS | Status: AC
  Administered 2021-11-30: 1500 mL via INTRAVENOUS

## 2021-11-30 NOTE — ED Provider Triage Note (Signed)
Emergency Medicine Provider Triage Evaluation Note  Randy Ortiz , a 36 y.o. male  was evaluated in triage.  Pt complains of chest pain and shortness of breath over the past few hours.  Patient states he took 7 Adderall throughout the day today.  He normally takes 1 20 mg Adderall daily.  He states he was trying to have more focus today.  He began to have chest pain and took 4 Klonopin to try to calm his heart rate down.  He states that he has severe shortness of breath with exertion since taking the medication.  He describes his chest pain as sharp and left-sided.  He denies radiation of symptoms.  Denies abdominal pain, nausea, vomiting  Review of Systems  Positive: Chest pain, shortness of breath Negative: Abdominal pain, nausea, vomiting  Physical Exam  BP (!) 151/103 (BP Location: Left Arm)   Pulse (!) 120   Temp 97.7 F (36.5 C) (Oral)   Resp 18   Ht 5\' 9"  (1.753 m)   Wt 104.3 kg   SpO2 100%   BMI 33.97 kg/m  Gen:   Awake, no distress   Resp:  Normal effort  MSK:   Moves extremities without difficulty  Other:    Medical Decision Making  Medically screening exam initiated at 9:28 PM.  Appropriate orders placed.  Randy Ortiz was informed that the remainder of the evaluation will be completed by another provider, this initial triage assessment does not replace that evaluation, and the importance of remaining in the ED until their evaluation is complete.     Randy Peng, PA-C 11/30/21 2130

## 2021-11-30 NOTE — ED Triage Notes (Signed)
Pt c/o chest pain starting at approx 1900 today. Pt states it worsens with exertion. Pt c/o SOB with exertion starting at same time.

## 2021-11-30 NOTE — ED Notes (Addendum)
Pt normally takes one 20 mg of Adderall daily. Pt states today he took 7 tablets throughout the day, 140 mg.  Pt states he normally takes 1 mg of Klonopin daily, today he states he took 4 mg of Klonopin. Pt denies HI and SI.

## 2021-11-30 NOTE — ED Provider Notes (Signed)
[facility] DEPT Provider Note   CSN: [id number] Arrival date & time: 11/30/21  2052     History  Chief Complaint  Patient presents with   Chest Pain    Randy Ortiz is a 36 y.o. male with a hx of anxiety, asthma, and tobacco use who presents to the ED with complaints of chest pain that began around 8PM. Patient reports pain is left sided, sharp, intermittent, no alleviating/aggravating factors. Started to have some associated dyspnea with activity. Of not he took 7 tablets of 20 mg IR Adderall throughout the day, first dose this AM, last dose around 5PM. He states he did this to help him focus, once his sxs started he was concerned this was the cause therefore he subsequently took 4 tablets of 1 mg Klonopin between 7-8PM to counter his sxs. Overall he is feeling improved, pain seems to be resolving. Denies additional no prescription drug use or alcohol use. Denies SI, HI, or hallucinations. Denies leg pain/swelling, hemoptysis, recent surgery/trauma, recent long travel, hormone use, personal hx of cancer, or hx of DVT/PE.    HPI     Home Medications Prior to Admission medications   Medication Sig Start Date End Date Taking? Authorizing Provider  albuterol (PROVENTIL HFA;VENTOLIN HFA) 108 (90 Base) MCG/ACT inhaler Inhale 1-2 puffs into the lungs every 6 (six) hours as needed for wheezing or shortness of breath.    [provider]  amphetamine-dextroamphetamine (ADDERALL) 20 MG tablet Take 20 mg by mouth 2 (two) times daily. 11/18/18   [provider]  clonazePAM (KLONOPIN) 0.5 MG tablet Take 1 tablet (0.5 mg total) by mouth 2 (two) times daily for 2 days. 10/15/19 10/17/19  Curatolo, Adam, DO  Multiple Vitamins-Minerals (MULTIVITAMIN ADULTS) TABS Take 1 tablet by mouth daily.    [provider]      Allergies    Amphetamine-dextroamphetamine    Review of Systems   Review of Systems  Respiratory:  Positive for shortness of  breath. Negative for cough.   Cardiovascular:  Positive for chest pain. Negative for leg swelling.  Gastrointestinal:  Negative for abdominal pain, nausea and vomiting.  Neurological:  Negative for seizures and syncope.  All other systems reviewed and are negative.  Physical Exam Updated Vital Signs BP (!) 151/103 (BP Location: Left Arm)   Pulse (!) 113   Temp 97.7 F (36.5 C) (Oral)   Resp 16   Ht 5\' 9"  (1.753 m)   Wt 104.3 kg   SpO2 95%   BMI 33.97 kg/m  Physical Exam Vitals and nursing note reviewed.  Constitutional:      General: He is not in acute distress.    Appearance: He is well-developed. He is not toxic-appearing.  HENT:     Head: Normocephalic and atraumatic.  Eyes:     General:        Right eye: No discharge.        Left eye: No discharge.     Conjunctiva/sclera: Conjunctivae normal.     Comments: Dilated pupils, equal, round, reactive.   Cardiovascular:     Rate and Rhythm: Regular rhythm. Tachycardia present.     Pulses:          Radial pulses are 2+ on the right side and 2+ on the left side.  Pulmonary:     Effort: No respiratory distress.     Breath sounds: Normal breath sounds. No wheezing or rales.  Abdominal:     General: There is no distension.  Palpations: Abdomen is soft.     Tenderness: There is no abdominal tenderness.  Musculoskeletal:     Cervical back: Neck supple.     Right lower leg: No tenderness. No edema.     Left lower leg: No tenderness. No edema.  Skin:    General: Skin is warm and dry.  Neurological:     Mental Status: He is alert.     Comments: Clear speech.   Psychiatric:        Behavior: Behavior normal.    ED Results / Procedures / Treatments   Labs (all labs ordered are listed, but only abnormal results are displayed) Labs Reviewed  BASIC METABOLIC PANEL - Abnormal; Notable for the following components:      Result Value   Glucose, Bld 119 (*)    All other components within normal limits  CBC - Abnormal;  Notable for the following components:   Hemoglobin 17.4 (*)    MCHC 36.6 (*)    All other components within normal limits  TROPONIN I (HIGH SENSITIVITY)  TROPONIN I (HIGH SENSITIVITY)    EKG EKG Interpretation  Date/Time:  Wednesday November 30 2021 20:59:14 EDT Ventricular Rate:  112 PR Interval:  127 QRS Duration: 82 QT Interval:  291 QTC Calculation: 398 R Axis:   55 Text Interpretation: Sinus tachycardia Probable left atrial enlargement Borderline repolarization abnormality Rate is faster Confirmed by Shanon Rosser [phone]) on 11/30/2021 10:40:58 PM  Radiology DG Chest 2 View  Result Date: 11/30/2021 CLINICAL DATA:  Chest pain. EXAM: CHEST - 2 VIEW COMPARISON:  Chest x-ray 10/15/2019 FINDINGS: The heart size and mediastinal contours are within normal limits. Both lungs are clear. The visualized skeletal structures are unremarkable. IMPRESSION: No active cardiopulmonary disease. Electronically Signed   By: Ronney Asters M.D.   On: 11/30/2021 21:17    Procedures Procedures    Medications Ordered in ED Medications - No data to display  ED Course/ Medical Decision Making/ A&P                           Medical Decision Making Amount and/or Complexity of Data Reviewed Labs: ordered. Radiology: ordered.   Patient presents to the emergency department with chest pain. Patient nontoxic appearing, in no apparent distress, vitals notable tachycardia, BP elevated as well.  DDX including but not limited to: ACS, pulmonary embolism, dissection, pneumothorax, pneumonia, arrhythmia, severe anemia, MSK, GERD, anxiety, medication side effect.  Additional history obtained:  Chart & nursing note reviewed.  [place] Controlled Substance reporting System queried  EKG: Sinus tachycardia.   Lab Tests:  I reviewed & interpreted labs including:  BC: No critical anemia BMP: No critical electrolyte derangement Troponin: Flat, within normal limits  Imaging Studies ordered:  I viewed the  following imaging, agree with radiologist impression:  Chest x-ray: No active cardiopulmonary disease.  ED Course:  I ordered medications including 1.5L NS for hydration & tachycardia.   23:30: CONSULT: Discussed w/ Lennette Bihari @ poison control-recommends observation for 6 to 8 hours following last dose of Klonopin.  Recommends benzos if patient becomes agitated, risk for tremors/seizures, and agree with fluids for tachycardia.    02:00: RE-EVAL: Patient resting comfortably, states he feels better, updated on results & plan of care.   03:20: RE-EVAL: Patient resting comfortably, symptoms have resolved, tachycardia has improved, he is able to ambulate without symptoms, he overall seems reasonable for discharge at this time.  Heart Pathway Score low risk- EKG without  obvious acute ischemia, delta troponin negative, doubt ACS. Patient is low risk wells, low suspicion for pulmonary embolism. Pain is not a tearing sensation, symmetric pulses, no widening of mediastinum on CXR, doubt dissection.  Labs and chest x-ray reassuring.  Given symptomatic improvement as well as improved vital signs feel patient is reasonable for discharge.  He was educated on the importance of only taking medications as prescribed, again confirmed this was not an attempt of self harm.  Recommended close PCP follow-up with strict return precautions.   Based on patient's chief complaint, I considered admission might be necessary, however after reassuring ED workup feel patient is reasonable for discharge.   I discussed results, treatment plan, need for PCP follow-up, and return precautions with the patient. Provided opportunity for questions, patient confirmed understanding and is in agreement with plan.    Portions of this note were generated with [employer]. Dictation errors may occur despite best attempts at proofreading.   Final Clinical Impression(s) / ED Diagnoses Final diagnoses:  Chest pain, unspecified  type  Misuse of medication    Rx / DC Orders ED Discharge Orders     None         Amaryllis Dyke, PA-C 12/01/21 0324    Molpus, Jenny Reichmann, MD 12/01/21 0425

## 2021-12-01 LAB — TROPONIN I (HIGH SENSITIVITY): Troponin I (High Sensitivity): 3 ng/L (ref <18)

## 2021-12-01 NOTE — Discharge Instructions (Addendum)
You were seen in the emergency department today for chest pain with trouble breathing after taking too much of your Adderall and subsequently your Klonopin.  Your lab work was overall reassuring.  Your chest x-ray was normal.  Please only take medication as specifically prescribed to you, taking medication differently than as prescribed is a safety hazard. Do not take your morning dose of adderall today.   Call your primary care provider for close follow-up.  Please rest.  Return to the emergency department for any new or worsening symptoms including but not limited to new or worsening pain, trouble breathing, dizziness, passing out, seizure activity, or any other concerns.   Your blood pressure was elevated in the emergency department, please have this rechecked by primary care.  Blood pressure (!) 141/90, pulse 80, temperature 97.7 F (36.5 C), temperature source Oral, resp. rate 13, height 5\' 9"  (1.753 m), weight 104.3 kg, SpO2 94 %.
